# Patient Record
Sex: Male | Born: 1940 | Race: White | Hispanic: No | Marital: Single
Health system: Southern US, Community
[De-identification: ages and names within clinical notes are randomized; demographics above are authoritative.]

---

## 2006-06-19 ENCOUNTER — Ambulatory Visit: Payer: Self-pay | Admitting: Specialist

## 2007-02-13 ENCOUNTER — Ambulatory Visit: Payer: Self-pay | Admitting: General Surgery

## 2007-02-13 ENCOUNTER — Other Ambulatory Visit: Payer: Self-pay

## 2007-02-19 ENCOUNTER — Ambulatory Visit: Payer: Self-pay | Admitting: General Surgery

## 2008-10-31 ENCOUNTER — Emergency Department: Payer: Self-pay | Admitting: Emergency Medicine

## 2009-05-30 ENCOUNTER — Emergency Department: Payer: Self-pay | Admitting: Emergency Medicine

## 2010-11-17 ENCOUNTER — Ambulatory Visit: Payer: Self-pay | Admitting: Radiation Oncology

## 2010-12-13 ENCOUNTER — Ambulatory Visit: Payer: Self-pay | Admitting: Specialist

## 2010-12-16 ENCOUNTER — Ambulatory Visit: Payer: Self-pay | Admitting: Radiation Oncology

## 2010-12-18 ENCOUNTER — Ambulatory Visit: Payer: Self-pay | Admitting: Radiation Oncology

## 2010-12-22 ENCOUNTER — Ambulatory Visit: Payer: Self-pay | Admitting: Radiation Oncology

## 2011-01-17 ENCOUNTER — Ambulatory Visit: Payer: Self-pay | Admitting: Radiation Oncology

## 2011-02-17 ENCOUNTER — Ambulatory Visit: Payer: Self-pay | Admitting: Radiation Oncology

## 2011-03-19 ENCOUNTER — Ambulatory Visit: Payer: Self-pay | Admitting: Radiation Oncology

## 2011-04-21 ENCOUNTER — Ambulatory Visit: Payer: Self-pay | Admitting: Radiation Oncology

## 2011-05-20 ENCOUNTER — Ambulatory Visit: Payer: Self-pay | Admitting: Radiation Oncology

## 2011-08-18 ENCOUNTER — Ambulatory Visit: Payer: Self-pay | Admitting: Radiation Oncology

## 2011-08-20 LAB — PSA: PSA: 14.4 ng/mL — ABNORMAL HIGH (ref 0.0–4.0)

## 2011-09-17 ENCOUNTER — Ambulatory Visit: Payer: Self-pay | Admitting: Radiation Oncology

## 2011-12-21 ENCOUNTER — Ambulatory Visit: Payer: Self-pay | Admitting: Radiation Oncology

## 2011-12-23 LAB — PSA: PSA: 50.1 ng/mL — ABNORMAL HIGH (ref 0.0–4.0)

## 2012-01-17 ENCOUNTER — Ambulatory Visit: Payer: Self-pay | Admitting: Radiation Oncology

## 2012-03-06 ENCOUNTER — Ambulatory Visit: Payer: Self-pay | Admitting: Radiation Oncology

## 2012-03-14 ENCOUNTER — Ambulatory Visit: Payer: Self-pay | Admitting: Oncology

## 2012-03-21 ENCOUNTER — Ambulatory Visit: Payer: Self-pay | Admitting: Oncology

## 2012-03-21 LAB — CBC CANCER CENTER
Basophil #: 0.1 x10 3/mm (ref 0.0–0.1)
Basophil %: 1.2 %
HCT: 42.2 % (ref 40.0–52.0)
HGB: 14.4 g/dL (ref 13.0–18.0)
Lymphocyte %: 11.9 %
MCHC: 34.2 g/dL (ref 32.0–36.0)
Monocyte %: 8.1 %
Neutrophil #: 5.8 x10 3/mm (ref 1.4–6.5)
Neutrophil %: 76.5 %
RBC: 4.93 10*6/uL (ref 4.40–5.90)
RDW: 13.2 % (ref 11.5–14.5)

## 2012-03-21 LAB — COMPREHENSIVE METABOLIC PANEL
Alkaline Phosphatase: 419 U/L — ABNORMAL HIGH (ref 50–136)
BUN: 38 mg/dL — ABNORMAL HIGH (ref 7–18)
Bilirubin,Total: 0.3 mg/dL (ref 0.2–1.0)
Chloride: 100 mmol/L (ref 98–107)
Glucose: 205 mg/dL — ABNORMAL HIGH (ref 65–99)
Osmolality: 289 (ref 275–301)
SGOT(AST): 15 U/L (ref 15–37)
SGPT (ALT): 22 U/L (ref 12–78)
Total Protein: 7.3 g/dL (ref 6.4–8.2)

## 2012-03-22 LAB — PSA: PSA: 130.6 ng/mL — ABNORMAL HIGH (ref 0.0–4.0)

## 2012-04-18 ENCOUNTER — Ambulatory Visit: Payer: Self-pay | Admitting: Oncology

## 2012-05-04 ENCOUNTER — Emergency Department: Payer: Self-pay | Admitting: Emergency Medicine

## 2012-05-04 LAB — COMPREHENSIVE METABOLIC PANEL
Albumin: 3.6 g/dL (ref 3.4–5.0)
Alkaline Phosphatase: 813 U/L — ABNORMAL HIGH (ref 50–136)
Anion Gap: 9 (ref 7–16)
BUN: 36 mg/dL — ABNORMAL HIGH (ref 7–18)
Bilirubin,Total: 0.4 mg/dL (ref 0.2–1.0)
Calcium, Total: 8 mg/dL — ABNORMAL LOW (ref 8.5–10.1)
Co2: 20 mmol/L — ABNORMAL LOW (ref 21–32)
EGFR (African American): >60
EGFR (Non-African Amer.): 54 — ABNORMAL LOW
Glucose: 217 mg/dL — ABNORMAL HIGH (ref 65–99)
Osmolality: 289 (ref 275–301)
SGPT (ALT): 20 U/L (ref 12–78)
Sodium: 137 mmol/L (ref 136–145)
Total Protein: 7 g/dL (ref 6.4–8.2)

## 2012-05-04 LAB — CBC WITH DIFFERENTIAL/PLATELET
Basophil #: 0.1 10*3/uL (ref 0.0–0.1)
Basophil %: 1.4 %
Eosinophil #: 0.2 10*3/uL (ref 0.0–0.7)
Eosinophil %: 4.5 %
HCT: 37.6 % — ABNORMAL LOW (ref 40.0–52.0)
Lymphocyte %: 13 %
MCH: 27.7 pg (ref 26.0–34.0)
Neutrophil #: 3.6 10*3/uL (ref 1.4–6.5)
RBC: 4.46 10*6/uL (ref 4.40–5.90)
RDW: 13.4 % (ref 11.5–14.5)
WBC: 5.1 10*3/uL (ref 3.8–10.6)

## 2012-05-04 LAB — SEDIMENTATION RATE: Erythrocyte Sed Rate: 18 mm/hr (ref 0–20)

## 2012-05-07 LAB — COMPREHENSIVE METABOLIC PANEL
Albumin: 3.8 g/dL (ref 3.4–5.0)
BUN: 47 mg/dL — ABNORMAL HIGH (ref 7–18)
Bilirubin,Total: 0.3 mg/dL (ref 0.2–1.0)
Chloride: 103 mmol/L (ref 98–107)
EGFR (Non-African Amer.): 37 — ABNORMAL LOW
Glucose: 175 mg/dL — ABNORMAL HIGH (ref 65–99)
Potassium: 5.6 mmol/L — ABNORMAL HIGH (ref 3.5–5.1)
SGOT(AST): 11 U/L — ABNORMAL LOW (ref 15–37)
SGPT (ALT): 19 U/L (ref 12–78)
Sodium: 135 mmol/L — ABNORMAL LOW (ref 136–145)

## 2012-05-07 LAB — CBC CANCER CENTER
Eosinophil #: 0.1 x10 3/mm (ref 0.0–0.7)
Lymphocyte #: 0.5 x10 3/mm — ABNORMAL LOW (ref 1.0–3.6)
Lymphocyte %: 10.5 %
MCV: 85 fL (ref 80–100)
Neutrophil %: 77.1 %
RBC: 4.71 10*6/uL (ref 4.40–5.90)
RDW: 14.1 % (ref 11.5–14.5)
WBC: 5.1 x10 3/mm (ref 3.8–10.6)

## 2012-05-19 ENCOUNTER — Ambulatory Visit: Payer: Self-pay | Admitting: Oncology

## 2012-05-19 ENCOUNTER — Emergency Department: Payer: Self-pay | Admitting: Emergency Medicine

## 2012-06-04 ENCOUNTER — Ambulatory Visit: Payer: Self-pay | Admitting: Vascular Surgery

## 2012-06-04 LAB — BASIC METABOLIC PANEL
Creatinine: 1.15 mg/dL (ref 0.60–1.30)
Potassium: 4.4 mmol/L (ref 3.5–5.1)

## 2012-06-05 ENCOUNTER — Emergency Department: Payer: Self-pay | Admitting: Emergency Medicine

## 2012-06-16 ENCOUNTER — Ambulatory Visit: Payer: Self-pay | Admitting: Oncology

## 2012-06-27 LAB — COMPREHENSIVE METABOLIC PANEL
Alkaline Phosphatase: 287 U/L — ABNORMAL HIGH (ref 50–136)
Anion Gap: 12 (ref 7–16)
BUN: 29 mg/dL — ABNORMAL HIGH (ref 7–18)
Bilirubin,Total: 0.3 mg/dL (ref 0.2–1.0)
Calcium, Total: 9.1 mg/dL (ref 8.5–10.1)
Chloride: 101 mmol/L (ref 98–107)
Co2: 26 mmol/L (ref 21–32)
EGFR (African American): 53 — ABNORMAL LOW
EGFR (Non-African Amer.): 45 — ABNORMAL LOW
Osmolality: 294 (ref 275–301)
Potassium: 4.6 mmol/L (ref 3.5–5.1)
SGOT(AST): 13 U/L — ABNORMAL LOW (ref 15–37)

## 2012-06-27 LAB — CBC CANCER CENTER
Eosinophil %: 5.7 %
Lymphocyte #: 0.9 x10 3/mm — ABNORMAL LOW (ref 1.0–3.6)
MCH: 28.5 pg (ref 26.0–34.0)
MCHC: 33 g/dL (ref 32.0–36.0)
Monocyte #: 0.4 x10 3/mm (ref 0.2–1.0)
Neutrophil #: 3.3 x10 3/mm (ref 1.4–6.5)
Neutrophil %: 66.9 %
Platelet: 266 x10 3/mm (ref 150–440)
RBC: 4.69 10*6/uL (ref 4.40–5.90)
RDW: 14.2 % (ref 11.5–14.5)
WBC: 5 x10 3/mm (ref 3.8–10.6)

## 2012-06-28 LAB — PSA: PSA: 27.1 ng/mL — ABNORMAL HIGH (ref 0.0–4.0)

## 2012-07-17 ENCOUNTER — Ambulatory Visit: Payer: Self-pay | Admitting: Oncology

## 2012-07-25 LAB — CBC CANCER CENTER
Basophil #: 0.1 x10 3/mm (ref 0.0–0.1)
Basophil %: 1.4 %
Eosinophil #: 0.2 x10 3/mm (ref 0.0–0.7)
Eosinophil %: 3.5 %
HCT: 37.1 % — ABNORMAL LOW (ref 40.0–52.0)
HGB: 12.4 g/dL — ABNORMAL LOW (ref 13.0–18.0)
Lymphocyte #: 0.8 x10 3/mm — ABNORMAL LOW (ref 1.0–3.6)
MCH: 28.5 pg (ref 26.0–34.0)
MCHC: 33.4 g/dL (ref 32.0–36.0)
Monocyte #: 0.5 x10 3/mm (ref 0.2–1.0)
Monocyte %: 8.4 %
Neutrophil #: 3.9 x10 3/mm (ref 1.4–6.5)
Neutrophil %: 72.2 %
Platelet: 272 x10 3/mm (ref 150–440)
RBC: 4.33 10*6/uL — ABNORMAL LOW (ref 4.40–5.90)
WBC: 5.4 x10 3/mm (ref 3.8–10.6)

## 2012-07-25 LAB — BASIC METABOLIC PANEL
Calcium, Total: 8.2 mg/dL — ABNORMAL LOW (ref 8.5–10.1)
Chloride: 101 mmol/L (ref 98–107)
Co2: 22 mmol/L (ref 21–32)
Creatinine: 1.58 mg/dL — ABNORMAL HIGH (ref 0.60–1.30)
EGFR (African American): 50 — ABNORMAL LOW
EGFR (Non-African Amer.): 43 — ABNORMAL LOW
Sodium: 135 mmol/L — ABNORMAL LOW (ref 136–145)

## 2012-07-26 LAB — PSA: PSA: 20.4 ng/mL — ABNORMAL HIGH (ref 0.0–4.0)

## 2012-08-16 ENCOUNTER — Ambulatory Visit: Payer: Self-pay | Admitting: Oncology

## 2012-09-05 LAB — COMPREHENSIVE METABOLIC PANEL
Alkaline Phosphatase: 162 U/L — ABNORMAL HIGH (ref 50–136)
BUN: 28 mg/dL — ABNORMAL HIGH (ref 7–18)
Chloride: 103 mmol/L (ref 98–107)
Co2: 23 mmol/L (ref 21–32)
Creatinine: 1.37 mg/dL — ABNORMAL HIGH (ref 0.60–1.30)
EGFR (African American): 60 — ABNORMAL LOW
Glucose: 299 mg/dL — ABNORMAL HIGH (ref 65–99)
Osmolality: 290 (ref 275–301)
SGPT (ALT): 28 U/L (ref 12–78)
Sodium: 137 mmol/L (ref 136–145)
Total Protein: 6.7 g/dL (ref 6.4–8.2)

## 2012-09-05 LAB — CBC CANCER CENTER
Basophil #: 0.1 x10 3/mm (ref 0.0–0.1)
Basophil %: 1.4 %
Eosinophil #: 0.3 x10 3/mm (ref 0.0–0.7)
HCT: 38.3 % — ABNORMAL LOW (ref 40.0–52.0)
HGB: 13 g/dL (ref 13.0–18.0)
Lymphocyte %: 11.8 %
MCH: 29.4 pg (ref 26.0–34.0)
Monocyte #: 0.3 x10 3/mm (ref 0.2–1.0)
Monocyte %: 6 %
Neutrophil #: 4.3 x10 3/mm (ref 1.4–6.5)

## 2012-09-06 LAB — PSA: PSA: 16.2 ng/mL — ABNORMAL HIGH (ref 0.0–4.0)

## 2012-09-16 ENCOUNTER — Ambulatory Visit: Payer: Self-pay | Admitting: Oncology

## 2012-10-23 ENCOUNTER — Ambulatory Visit: Payer: Self-pay | Admitting: Oncology

## 2012-10-24 LAB — COMPREHENSIVE METABOLIC PANEL
Albumin: 3.5 g/dL (ref 3.4–5.0)
Anion Gap: 8 (ref 7–16)
Bilirubin,Total: 0.3 mg/dL (ref 0.2–1.0)
Co2: 20 mmol/L — ABNORMAL LOW (ref 21–32)
Creatinine: 1.79 mg/dL — ABNORMAL HIGH (ref 0.60–1.30)
EGFR (African American): 43 — ABNORMAL LOW
EGFR (Non-African Amer.): 37 — ABNORMAL LOW
Osmolality: 300 (ref 275–301)
Potassium: 4.9 mmol/L (ref 3.5–5.1)
SGOT(AST): 29 U/L (ref 15–37)
SGPT (ALT): 60 U/L (ref 12–78)

## 2012-10-24 LAB — CBC CANCER CENTER
Basophil #: 0.1 x10 3/mm (ref 0.0–0.1)
Basophil %: 1 %
Eosinophil #: 0.2 x10 3/mm (ref 0.0–0.7)
Eosinophil %: 4.4 %
HCT: 36.9 % — ABNORMAL LOW (ref 40.0–52.0)
Lymphocyte #: 0.7 x10 3/mm — ABNORMAL LOW (ref 1.0–3.6)
Lymphocyte %: 13.9 %
MCHC: 34.1 g/dL (ref 32.0–36.0)
Monocyte #: 0.4 x10 3/mm (ref 0.2–1.0)
Monocyte %: 7.7 %
Neutrophil %: 73 %
Platelet: 295 x10 3/mm (ref 150–440)
RBC: 4.3 10*6/uL — ABNORMAL LOW (ref 4.40–5.90)
WBC: 5.4 x10 3/mm (ref 3.8–10.6)

## 2012-10-25 LAB — PSA: PSA: 21.6 ng/mL — ABNORMAL HIGH (ref 0.0–4.0)

## 2012-11-16 ENCOUNTER — Ambulatory Visit: Payer: Self-pay | Admitting: Oncology

## 2012-11-29 LAB — CBC CANCER CENTER
Basophil #: 0.1 x10 3/mm (ref 0.0–0.1)
Eosinophil #: 0.2 x10 3/mm (ref 0.0–0.7)
Eosinophil %: 4.8 %
HGB: 13.3 g/dL (ref 13.0–18.0)
Lymphocyte #: 0.9 x10 3/mm — ABNORMAL LOW (ref 1.0–3.6)
MCH: 30 pg (ref 26.0–34.0)
MCHC: 34.9 g/dL (ref 32.0–36.0)
Platelet: 308 x10 3/mm (ref 150–440)
RBC: 4.42 10*6/uL (ref 4.40–5.90)
WBC: 4.4 x10 3/mm (ref 3.8–10.6)

## 2012-11-29 LAB — COMPREHENSIVE METABOLIC PANEL
Alkaline Phosphatase: 133 U/L (ref 50–136)
BUN: 29 mg/dL — ABNORMAL HIGH (ref 7–18)
Bilirubin,Total: 0.3 mg/dL (ref 0.2–1.0)
Calcium, Total: 9.3 mg/dL (ref 8.5–10.1)
Chloride: 103 mmol/L (ref 98–107)
Co2: 25 mmol/L (ref 21–32)
Creatinine: 1.52 mg/dL — ABNORMAL HIGH (ref 0.60–1.30)
EGFR (African American): 53 — ABNORMAL LOW
EGFR (Non-African Amer.): 45 — ABNORMAL LOW
Glucose: 198 mg/dL — ABNORMAL HIGH (ref 65–99)
Osmolality: 285 (ref 275–301)
Potassium: 4.7 mmol/L (ref 3.5–5.1)
SGPT (ALT): 32 U/L (ref 12–78)
Total Protein: 7.1 g/dL (ref 6.4–8.2)

## 2012-11-30 LAB — PSA: PSA: 24.3 ng/mL — ABNORMAL HIGH (ref 0.0–4.0)

## 2012-12-17 ENCOUNTER — Ambulatory Visit: Payer: Self-pay | Admitting: Oncology

## 2013-01-01 ENCOUNTER — Emergency Department: Payer: Self-pay | Admitting: Emergency Medicine

## 2013-01-01 LAB — URINALYSIS, COMPLETE
Bilirubin,UR: NEGATIVE
Blood: NEGATIVE
Glucose,UR: 300 mg/dL (ref 0–75)
Hyaline Cast: 5
Ketone: NEGATIVE
Leukocyte Esterase: NEGATIVE
Nitrite: NEGATIVE
Protein: NEGATIVE
Specific Gravity: 1.015 (ref 1.003–1.030)
WBC UR: 1 /HPF (ref 0–5)

## 2013-01-01 LAB — CBC
HGB: 14.5 g/dL (ref 13.0–18.0)
MCHC: 36 g/dL (ref 32.0–36.0)
Platelet: 283 10*3/uL (ref 150–440)

## 2013-01-01 LAB — COMPREHENSIVE METABOLIC PANEL
Alkaline Phosphatase: 251 U/L — ABNORMAL HIGH (ref 50–136)
Chloride: 102 mmol/L (ref 98–107)
Co2: 19 mmol/L — ABNORMAL LOW (ref 21–32)
EGFR (African American): 51 — ABNORMAL LOW
EGFR (Non-African Amer.): 44 — ABNORMAL LOW
Glucose: 440 mg/dL — ABNORMAL HIGH (ref 65–99)
SGOT(AST): 15 U/L (ref 15–37)
SGPT (ALT): 25 U/L (ref 12–78)
Total Protein: 7.1 g/dL (ref 6.4–8.2)

## 2013-02-12 ENCOUNTER — Ambulatory Visit: Payer: Self-pay | Admitting: Oncology

## 2013-02-19 ENCOUNTER — Ambulatory Visit: Payer: Self-pay | Admitting: Oncology

## 2013-02-19 LAB — CBC CANCER CENTER
Basophil #: 0 x10 3/mm (ref 0.0–0.1)
Eosinophil #: 0.3 x10 3/mm (ref 0.0–0.7)
Eosinophil %: 4.1 %
Lymphocyte #: 1 x10 3/mm (ref 1.0–3.6)
MCH: 28.1 pg (ref 26.0–34.0)
MCHC: 32.9 g/dL (ref 32.0–36.0)
Neutrophil #: 4.8 x10 3/mm (ref 1.4–6.5)
WBC: 6.6 x10 3/mm (ref 3.8–10.6)

## 2013-02-19 LAB — COMPREHENSIVE METABOLIC PANEL
Anion Gap: 11 (ref 7–16)
Bilirubin,Total: 0.3 mg/dL (ref 0.2–1.0)
Co2: 25 mmol/L (ref 21–32)
Creatinine: 1.62 mg/dL — ABNORMAL HIGH (ref 0.60–1.30)
EGFR (Non-African Amer.): 42 — ABNORMAL LOW
Osmolality: 288 (ref 275–301)
SGPT (ALT): 42 U/L (ref 12–78)
Sodium: 131 mmol/L — ABNORMAL LOW (ref 136–145)

## 2013-03-18 ENCOUNTER — Ambulatory Visit: Payer: Self-pay | Admitting: Oncology

## 2013-04-18 ENCOUNTER — Ambulatory Visit: Payer: Self-pay | Admitting: Oncology

## 2013-06-07 ENCOUNTER — Ambulatory Visit: Payer: Self-pay | Admitting: Oncology

## 2013-07-02 ENCOUNTER — Ambulatory Visit: Payer: Self-pay | Admitting: Vascular Surgery

## 2013-07-08 LAB — COMPREHENSIVE METABOLIC PANEL
ALK PHOS: 831 U/L — AB
Albumin: 2.5 g/dL — ABNORMAL LOW (ref 3.4–5.0)
Anion Gap: 7 (ref 7–16)
BILIRUBIN TOTAL: 0.4 mg/dL (ref 0.2–1.0)
BUN: 20 mg/dL — ABNORMAL HIGH (ref 7–18)
Calcium, Total: 8.6 mg/dL (ref 8.5–10.1)
Chloride: 100 mmol/L (ref 98–107)
Co2: 23 mmol/L (ref 21–32)
Creatinine: 1.68 mg/dL — ABNORMAL HIGH (ref 0.60–1.30)
EGFR (African American): 46 — ABNORMAL LOW
EGFR (Non-African Amer.): 40 — ABNORMAL LOW
Glucose: 442 mg/dL — ABNORMAL HIGH (ref 65–99)
Osmolality: 282 (ref 275–301)
Potassium: 4.3 mmol/L (ref 3.5–5.1)
SGOT(AST): 16 U/L (ref 15–37)
SGPT (ALT): 11 U/L — ABNORMAL LOW (ref 12–78)
SODIUM: 130 mmol/L — AB (ref 136–145)
Total Protein: 6.4 g/dL (ref 6.4–8.2)

## 2013-07-08 LAB — CBC
HCT: 28.2 % — AB (ref 40.0–52.0)
HGB: 9.2 g/dL — ABNORMAL LOW (ref 13.0–18.0)
MCH: 27 pg (ref 26.0–34.0)
MCHC: 32.8 g/dL (ref 32.0–36.0)
MCV: 83 fL (ref 80–100)
PLATELETS: 369 10*3/uL (ref 150–440)
RBC: 3.42 10*6/uL — AB (ref 4.40–5.90)
RDW: 13.9 % (ref 11.5–14.5)
WBC: 12.3 10*3/uL — AB (ref 3.8–10.6)

## 2013-07-08 LAB — PROTIME-INR
INR: 1.2
Prothrombin Time: 15.3 secs — ABNORMAL HIGH (ref 11.5–14.7)

## 2013-07-08 LAB — TROPONIN I: TROPONIN-I: 0.04 ng/mL

## 2013-07-08 LAB — CK: CK, Total: 128 U/L

## 2013-07-08 LAB — LIPASE, BLOOD: LIPASE: 112 U/L (ref 73–393)

## 2013-07-09 ENCOUNTER — Ambulatory Visit: Payer: Self-pay | Admitting: Oncology

## 2013-07-09 ENCOUNTER — Inpatient Hospital Stay: Payer: Self-pay | Admitting: Internal Medicine

## 2013-07-09 LAB — URINALYSIS, COMPLETE
BILIRUBIN, UR: NEGATIVE
Bacteria: NONE SEEN
Blood: NEGATIVE
Glucose,UR: >=500 mg/dL (ref 0–75)
Ketone: NEGATIVE
Leukocyte Esterase: NEGATIVE
Nitrite: NEGATIVE
Ph: 5 (ref 4.5–8.0)
Protein: NEGATIVE
RBC,UR: NONE SEEN /HPF (ref 0–5)
SPECIFIC GRAVITY: 1.026 (ref 1.003–1.030)
Squamous Epithelial: NONE SEEN

## 2013-07-09 LAB — COMPREHENSIVE METABOLIC PANEL
Albumin: 2.3 g/dL — ABNORMAL LOW (ref 3.4–5.0)
Alkaline Phosphatase: 681 U/L — ABNORMAL HIGH
Anion Gap: 7 (ref 7–16)
BUN: 19 mg/dL — ABNORMAL HIGH (ref 7–18)
Bilirubin,Total: 1.2 mg/dL — ABNORMAL HIGH (ref 0.2–1.0)
CO2: 24 mmol/L (ref 21–32)
Calcium, Total: 8.6 mg/dL (ref 8.5–10.1)
Chloride: 104 mmol/L (ref 98–107)
Creatinine: 1.63 mg/dL — ABNORMAL HIGH (ref 0.60–1.30)
EGFR (African American): 48 — ABNORMAL LOW
EGFR (Non-African Amer.): 41 — ABNORMAL LOW
GLUCOSE: 228 mg/dL — AB (ref 65–99)
Osmolality: 280 (ref 275–301)
POTASSIUM: 4.2 mmol/L (ref 3.5–5.1)
SGOT(AST): 32 U/L (ref 15–37)
SGPT (ALT): 14 U/L (ref 12–78)
Sodium: 135 mmol/L — ABNORMAL LOW (ref 136–145)
Total Protein: 6 g/dL — ABNORMAL LOW (ref 6.4–8.2)

## 2013-07-09 LAB — HEMOGLOBIN
HGB: 10.7 g/dL — ABNORMAL LOW (ref 13.0–18.0)
HGB: 10.4 g/dL — AB (ref 13.0–18.0)

## 2013-07-09 LAB — HEMOGLOBIN A1C: Hemoglobin A1C: 10.3 % — ABNORMAL HIGH (ref 4.2–6.3)

## 2013-07-09 LAB — BILIRUBIN, DIRECT: BILIRUBIN DIRECT: 0.2 mg/dL (ref 0.00–0.20)

## 2013-07-10 LAB — CBC WITH DIFFERENTIAL/PLATELET
BASOS ABS: 0.1 10*3/uL (ref 0.0–0.1)
Basophil %: 0.8 %
EOS PCT: 2.7 %
Eosinophil #: 0.2 10*3/uL (ref 0.0–0.7)
HCT: 33.5 % — ABNORMAL LOW (ref 40.0–52.0)
HGB: 11.4 g/dL — AB (ref 13.0–18.0)
LYMPHS ABS: 0.8 10*3/uL — AB (ref 1.0–3.6)
Lymphocyte %: 10.5 %
MCH: 28.6 pg (ref 26.0–34.0)
MCHC: 34.2 g/dL (ref 32.0–36.0)
MCV: 84 fL (ref 80–100)
Monocyte #: 0.6 x10 3/mm (ref 0.2–1.0)
Monocyte %: 8.6 %
NEUTROS PCT: 77.4 %
Neutrophil #: 5.8 10*3/uL (ref 1.4–6.5)
Platelet: 322 10*3/uL (ref 150–440)
RBC: 4 10*6/uL — ABNORMAL LOW (ref 4.40–5.90)
RDW: 14.8 % — ABNORMAL HIGH (ref 11.5–14.5)
WBC: 7.5 10*3/uL (ref 3.8–10.6)

## 2013-07-10 LAB — BASIC METABOLIC PANEL
Anion Gap: 7 (ref 7–16)
BUN: 18 mg/dL (ref 7–18)
CO2: 21 mmol/L (ref 21–32)
CREATININE: 1.53 mg/dL — AB (ref 0.60–1.30)
Calcium, Total: 8.6 mg/dL (ref 8.5–10.1)
Chloride: 103 mmol/L (ref 98–107)
EGFR (African American): 52 — ABNORMAL LOW
EGFR (Non-African Amer.): 45 — ABNORMAL LOW
GLUCOSE: 227 mg/dL — AB (ref 65–99)
Osmolality: 272 (ref 275–301)
POTASSIUM: 4.2 mmol/L (ref 3.5–5.1)
SODIUM: 131 mmol/L — AB (ref 136–145)

## 2013-07-11 LAB — HEMOGLOBIN: HGB: 11.3 g/dL — ABNORMAL LOW (ref 13.0–18.0)

## 2013-07-12 LAB — BASIC METABOLIC PANEL
Anion Gap: 7 (ref 7–16)
BUN: 22 mg/dL — ABNORMAL HIGH (ref 7–18)
CHLORIDE: 104 mmol/L (ref 98–107)
CO2: 25 mmol/L (ref 21–32)
CREATININE: 1.71 mg/dL — AB (ref 0.60–1.30)
Calcium, Total: 8.7 mg/dL (ref 8.5–10.1)
EGFR (African American): 45 — ABNORMAL LOW
GFR CALC NON AF AMER: 39 — AB
Glucose: 99 mg/dL (ref 65–99)
Osmolality: 275 (ref 275–301)
POTASSIUM: 4 mmol/L (ref 3.5–5.1)
Sodium: 136 mmol/L (ref 136–145)

## 2013-07-13 LAB — BASIC METABOLIC PANEL
ANION GAP: 5 — AB (ref 7–16)
BUN: 22 mg/dL — AB (ref 7–18)
CALCIUM: 8.1 mg/dL — AB (ref 8.5–10.1)
CO2: 26 mmol/L (ref 21–32)
CREATININE: 1.77 mg/dL — AB (ref 0.60–1.30)
Chloride: 100 mmol/L (ref 98–107)
EGFR (Non-African Amer.): 38 — ABNORMAL LOW
GFR CALC AF AMER: 44 — AB
Glucose: 350 mg/dL — ABNORMAL HIGH (ref 65–99)
OSMOLALITY: 280 (ref 275–301)
POTASSIUM: 4.6 mmol/L (ref 3.5–5.1)
SODIUM: 131 mmol/L — AB (ref 136–145)

## 2013-07-13 LAB — CBC WITH DIFFERENTIAL/PLATELET
Basophil #: 0.1 10*3/uL (ref 0.0–0.1)
Basophil %: 0.8 %
EOS PCT: 0.7 %
Eosinophil #: 0.1 10*3/uL (ref 0.0–0.7)
HCT: 33.2 % — ABNORMAL LOW (ref 40.0–52.0)
HGB: 11.3 g/dL — AB (ref 13.0–18.0)
Lymphocyte #: 0.9 10*3/uL — ABNORMAL LOW (ref 1.0–3.6)
Lymphocyte %: 9.1 %
MCH: 28.5 pg (ref 26.0–34.0)
MCHC: 34 g/dL (ref 32.0–36.0)
MCV: 84 fL (ref 80–100)
MONOS PCT: 10.9 %
Monocyte #: 1 x10 3/mm (ref 0.2–1.0)
NEUTROS ABS: 7.3 10*3/uL — AB (ref 1.4–6.5)
NEUTROS PCT: 78.5 %
Platelet: 273 10*3/uL (ref 150–440)
RBC: 3.97 10*6/uL — AB (ref 4.40–5.90)
RDW: 14.9 % — ABNORMAL HIGH (ref 11.5–14.5)
WBC: 9.4 10*3/uL (ref 3.8–10.6)

## 2013-07-14 LAB — BASIC METABOLIC PANEL
Anion Gap: 6 — ABNORMAL LOW (ref 7–16)
BUN: 21 mg/dL — ABNORMAL HIGH (ref 7–18)
CHLORIDE: 101 mmol/L (ref 98–107)
CO2: 26 mmol/L (ref 21–32)
CREATININE: 1.62 mg/dL — AB (ref 0.60–1.30)
Calcium, Total: 8.7 mg/dL (ref 8.5–10.1)
EGFR (African American): 48 — ABNORMAL LOW
GFR CALC NON AF AMER: 42 — AB
Glucose: 192 mg/dL — ABNORMAL HIGH (ref 65–99)
OSMOLALITY: 275 (ref 275–301)
Potassium: 4.1 mmol/L (ref 3.5–5.1)
Sodium: 133 mmol/L — ABNORMAL LOW (ref 136–145)

## 2013-07-14 LAB — CBC WITH DIFFERENTIAL/PLATELET
BASOS ABS: 0.1 10*3/uL (ref 0.0–0.1)
Basophil %: 0.7 %
EOS ABS: 0.3 10*3/uL (ref 0.0–0.7)
Eosinophil %: 2.9 %
HCT: 34.5 % — ABNORMAL LOW (ref 40.0–52.0)
HGB: 11.6 g/dL — AB (ref 13.0–18.0)
Lymphocyte #: 1 10*3/uL (ref 1.0–3.6)
Lymphocyte %: 11 %
MCH: 28 pg (ref 26.0–34.0)
MCHC: 33.5 g/dL (ref 32.0–36.0)
MCV: 84 fL (ref 80–100)
MONO ABS: 0.9 x10 3/mm (ref 0.2–1.0)
Monocyte %: 9.9 %
Neutrophil #: 7 10*3/uL — ABNORMAL HIGH (ref 1.4–6.5)
Neutrophil %: 75.5 %
Platelet: 310 10*3/uL (ref 150–440)
RBC: 4.13 10*6/uL — AB (ref 4.40–5.90)
RDW: 15 % — AB (ref 11.5–14.5)
WBC: 9.3 10*3/uL (ref 3.8–10.6)

## 2013-07-17 ENCOUNTER — Ambulatory Visit: Payer: Self-pay | Admitting: Oncology

## 2013-07-17 LAB — CULTURE, BLOOD (SINGLE)

## 2013-07-18 LAB — PATHOLOGY REPORT

## 2013-08-29 ENCOUNTER — Ambulatory Visit: Payer: Self-pay | Admitting: Oncology

## 2013-09-04 LAB — COMPREHENSIVE METABOLIC PANEL
AST: 11 U/L — AB (ref 15–37)
Albumin: 3.4 g/dL (ref 3.4–5.0)
Alkaline Phosphatase: 1376 U/L — ABNORMAL HIGH
Anion Gap: 10 (ref 7–16)
BUN: 25 mg/dL — ABNORMAL HIGH (ref 7–18)
Bilirubin,Total: 0.2 mg/dL (ref 0.2–1.0)
CREATININE: 1.48 mg/dL — AB (ref 0.60–1.30)
Calcium, Total: 9.1 mg/dL (ref 8.5–10.1)
Chloride: 106 mmol/L (ref 98–107)
Co2: 26 mmol/L (ref 21–32)
EGFR (Non-African Amer.): 47 — ABNORMAL LOW
GFR CALC AF AMER: 54 — AB
Glucose: 170 mg/dL — ABNORMAL HIGH (ref 65–99)
Osmolality: 291 (ref 275–301)
POTASSIUM: 4.5 mmol/L (ref 3.5–5.1)
SGPT (ALT): 17 U/L (ref 12–78)
Sodium: 142 mmol/L (ref 136–145)
TOTAL PROTEIN: 7 g/dL (ref 6.4–8.2)

## 2013-09-04 LAB — CBC CANCER CENTER
BASOS PCT: 1.5 %
Basophil #: 0.1 x10 3/mm (ref 0.0–0.1)
Eosinophil #: 0.3 x10 3/mm (ref 0.0–0.7)
Eosinophil %: 6.1 %
HCT: 34.6 % — AB (ref 40.0–52.0)
HGB: 11.4 g/dL — ABNORMAL LOW (ref 13.0–18.0)
LYMPHS PCT: 16.1 %
Lymphocyte #: 0.8 x10 3/mm — ABNORMAL LOW (ref 1.0–3.6)
MCH: 27.5 pg (ref 26.0–34.0)
MCHC: 33.1 g/dL (ref 32.0–36.0)
MCV: 83 fL (ref 80–100)
Monocyte #: 0.5 x10 3/mm (ref 0.2–1.0)
Monocyte %: 9.4 %
NEUTROS ABS: 3.5 x10 3/mm (ref 1.4–6.5)
Neutrophil %: 66.9 %
Platelet: 213 x10 3/mm (ref 150–440)
RBC: 4.16 10*6/uL — ABNORMAL LOW (ref 4.40–5.90)
RDW: 16.3 % — ABNORMAL HIGH (ref 11.5–14.5)
WBC: 5.2 x10 3/mm (ref 3.8–10.6)

## 2013-09-05 LAB — PSA: PSA: 98.3 ng/mL — ABNORMAL HIGH (ref 0.0–4.0)

## 2013-09-16 ENCOUNTER — Ambulatory Visit: Payer: Self-pay | Admitting: Oncology

## 2013-10-02 ENCOUNTER — Ambulatory Visit: Payer: Self-pay | Admitting: Urology

## 2013-10-16 ENCOUNTER — Emergency Department: Payer: Self-pay | Admitting: Emergency Medicine

## 2013-10-16 ENCOUNTER — Ambulatory Visit: Payer: Self-pay | Admitting: Oncology

## 2013-10-16 LAB — CBC CANCER CENTER
Basophil #: 0.1 "x10 3/mm "
Basophil %: 1 %
Eosinophil #: 0.3 "x10 3/mm "
Eosinophil %: 6.6 %
HCT: 36.4 % — ABNORMAL LOW
HGB: 12 g/dL — ABNORMAL LOW
Lymphocyte %: 17.7 %
Lymphs Abs: 0.9 "x10 3/mm " — ABNORMAL LOW
MCH: 27.9 pg
MCHC: 33 g/dL
MCV: 85 fL
Monocyte #: 0.5 "x10 3/mm "
Monocyte %: 8.8 %
Neutrophil #: 3.4 "x10 3/mm "
Neutrophil %: 65.9 %
Platelet: 208 "x10 3/mm "
RBC: 4.3 "x10 6/mm " — ABNORMAL LOW
RDW: 15.2 % — ABNORMAL HIGH
WBC: 5.2 "x10 3/mm "

## 2013-10-16 LAB — COMPREHENSIVE METABOLIC PANEL WITH GFR
Albumin: 3.4 g/dL
Alkaline Phosphatase: 909 U/L — ABNORMAL HIGH
Anion Gap: 7
BUN: 29 mg/dL — ABNORMAL HIGH
Bilirubin,Total: 0.3 mg/dL
Calcium, Total: 9.2 mg/dL
Chloride: 104 mmol/L
Co2: 26 mmol/L
Creatinine: 1.57 mg/dL — ABNORMAL HIGH
EGFR (African American): 50 — ABNORMAL LOW
EGFR (Non-African Amer.): 43 — ABNORMAL LOW
Glucose: 118 mg/dL — ABNORMAL HIGH
Osmolality: 281
Potassium: 4.6 mmol/L
SGOT(AST): 17 U/L
SGPT (ALT): 18 U/L
Sodium: 137 mmol/L
Total Protein: 6.9 g/dL

## 2013-10-17 LAB — PSA: PSA: 127.4 ng/mL — AB (ref 0.0–4.0)

## 2013-10-22 ENCOUNTER — Ambulatory Visit: Payer: Self-pay | Admitting: Urology

## 2013-11-03 ENCOUNTER — Emergency Department: Payer: Self-pay | Admitting: Emergency Medicine

## 2013-11-16 ENCOUNTER — Ambulatory Visit: Payer: Self-pay | Admitting: Oncology

## 2013-12-16 LAB — CBC CANCER CENTER
Basophil #: 0.1 x10 3/mm (ref 0.0–0.1)
Basophil %: 1.1 %
EOS ABS: 0.3 x10 3/mm (ref 0.0–0.7)
Eosinophil %: 5.1 %
HCT: 35.4 % — ABNORMAL LOW (ref 40.0–52.0)
HGB: 11.8 g/dL — ABNORMAL LOW (ref 13.0–18.0)
Lymphocyte #: 1 x10 3/mm (ref 1.0–3.6)
Lymphocyte %: 14.8 %
MCH: 27.7 pg (ref 26.0–34.0)
MCHC: 33.2 g/dL (ref 32.0–36.0)
MCV: 84 fL (ref 80–100)
Monocyte #: 0.6 x10 3/mm (ref 0.2–1.0)
Monocyte %: 9.3 %
NEUTROS PCT: 69.7 %
Neutrophil #: 4.5 x10 3/mm (ref 1.4–6.5)
PLATELETS: 353 x10 3/mm (ref 150–440)
RBC: 4.24 10*6/uL — ABNORMAL LOW (ref 4.40–5.90)
RDW: 13.2 % (ref 11.5–14.5)
WBC: 6.4 x10 3/mm (ref 3.8–10.6)

## 2013-12-16 LAB — COMPREHENSIVE METABOLIC PANEL
ALBUMIN: 3.3 g/dL — AB (ref 3.4–5.0)
ANION GAP: 11 (ref 7–16)
Alkaline Phosphatase: 959 U/L — ABNORMAL HIGH
BUN: 28 mg/dL — ABNORMAL HIGH (ref 7–18)
Bilirubin,Total: 0.3 mg/dL (ref 0.2–1.0)
CALCIUM: 9.5 mg/dL (ref 8.5–10.1)
CO2: 25 mmol/L (ref 21–32)
Chloride: 99 mmol/L (ref 98–107)
Creatinine: 1.6 mg/dL — ABNORMAL HIGH (ref 0.60–1.30)
EGFR (Non-African Amer.): 42 — ABNORMAL LOW
GFR CALC AF AMER: 49 — AB
Glucose: 172 mg/dL — ABNORMAL HIGH (ref 65–99)
Osmolality: 280 (ref 275–301)
Potassium: 4.4 mmol/L (ref 3.5–5.1)
SGOT(AST): 25 U/L (ref 15–37)
SGPT (ALT): 14 U/L
Sodium: 135 mmol/L — ABNORMAL LOW (ref 136–145)
Total Protein: 7.4 g/dL (ref 6.4–8.2)

## 2013-12-17 ENCOUNTER — Ambulatory Visit: Payer: Self-pay | Admitting: Internal Medicine

## 2013-12-17 ENCOUNTER — Ambulatory Visit: Payer: Self-pay | Admitting: Oncology

## 2013-12-17 LAB — PSA: PSA: 256.4 ng/mL — ABNORMAL HIGH (ref 0.0–4.0)

## 2013-12-24 ENCOUNTER — Ambulatory Visit: Payer: Self-pay | Admitting: Urology

## 2013-12-24 LAB — CBC
HCT: 34.1 % — ABNORMAL LOW (ref 40.0–52.0)
HGB: 11 g/dL — AB (ref 13.0–18.0)
MCH: 26.6 pg (ref 26.0–34.0)
MCHC: 32.2 g/dL (ref 32.0–36.0)
MCV: 83 fL (ref 80–100)
PLATELETS: 268 10*3/uL (ref 150–440)
RBC: 4.13 10*6/uL — ABNORMAL LOW (ref 4.40–5.90)
RDW: 13.5 % (ref 11.5–14.5)
WBC: 5.9 10*3/uL (ref 3.8–10.6)

## 2013-12-24 LAB — BASIC METABOLIC PANEL
ANION GAP: 7 (ref 7–16)
BUN: 30 mg/dL — ABNORMAL HIGH (ref 7–18)
CALCIUM: 8.9 mg/dL (ref 8.5–10.1)
CHLORIDE: 99 mmol/L (ref 98–107)
CO2: 24 mmol/L (ref 21–32)
Creatinine: 1.62 mg/dL — ABNORMAL HIGH (ref 0.60–1.30)
EGFR (African American): 48 — ABNORMAL LOW
EGFR (Non-African Amer.): 42 — ABNORMAL LOW
GLUCOSE: 230 mg/dL — AB (ref 65–99)
Osmolality: 274 (ref 275–301)
POTASSIUM: 4.3 mmol/L (ref 3.5–5.1)
SODIUM: 130 mmol/L — AB (ref 136–145)

## 2014-01-06 ENCOUNTER — Ambulatory Visit: Payer: Self-pay | Admitting: Urology

## 2014-01-07 ENCOUNTER — Observation Stay: Payer: Self-pay | Admitting: Internal Medicine

## 2014-01-07 LAB — CBC
HCT: 28.8 % — ABNORMAL LOW (ref 40.0–52.0)
HGB: 9.8 g/dL — AB (ref 13.0–18.0)
MCH: 27.4 pg (ref 26.0–34.0)
MCHC: 34 g/dL (ref 32.0–36.0)
MCV: 81 fL (ref 80–100)
Platelet: 225 10*3/uL (ref 150–440)
RBC: 3.58 10*6/uL — AB (ref 4.40–5.90)
RDW: 13.7 % (ref 11.5–14.5)
WBC: 9.3 10*3/uL (ref 3.8–10.6)

## 2014-01-07 LAB — URINALYSIS, COMPLETE
Bacteria: NONE SEEN
Bilirubin,UR: NEGATIVE
Glucose,UR: 50 mg/dL
Ketone: NEGATIVE
Leukocyte Esterase: NEGATIVE
Nitrite: NEGATIVE
Ph: 6
Protein: 150
RBC,UR: 19450 /HPF
Specific Gravity: 1.015
Squamous Epithelial: NONE SEEN
WBC UR: 144 /HPF

## 2014-01-07 LAB — COMPREHENSIVE METABOLIC PANEL
ANION GAP: 8 (ref 7–16)
AST: 623 U/L — AB (ref 15–37)
Albumin: 2.8 g/dL — ABNORMAL LOW (ref 3.4–5.0)
Alkaline Phosphatase: 1352 U/L — ABNORMAL HIGH
BUN: 29 mg/dL — AB (ref 7–18)
Bilirubin,Total: 0.4 mg/dL (ref 0.2–1.0)
CO2: 24 mmol/L (ref 21–32)
CREATININE: 1.68 mg/dL — AB (ref 0.60–1.30)
Calcium, Total: 8.7 mg/dL (ref 8.5–10.1)
Chloride: 97 mmol/L — ABNORMAL LOW (ref 98–107)
EGFR (African American): 46 — ABNORMAL LOW
GFR CALC NON AF AMER: 40 — AB
Glucose: 243 mg/dL — ABNORMAL HIGH (ref 65–99)
OSMOLALITY: 273 (ref 275–301)
Potassium: 4.7 mmol/L (ref 3.5–5.1)
SGPT (ALT): 20 U/L
SODIUM: 129 mmol/L — AB (ref 136–145)
TOTAL PROTEIN: 6.4 g/dL (ref 6.4–8.2)

## 2014-01-07 LAB — PRO B NATRIURETIC PEPTIDE: B-TYPE NATIURETIC PEPTID: 1468 pg/mL — AB (ref 0–125)

## 2014-01-07 LAB — TROPONIN I: Troponin-I: <0.02 ng/mL

## 2014-01-07 LAB — TSH: Thyroid Stimulating Horm: 1.78 u[IU]/mL

## 2014-01-09 LAB — BASIC METABOLIC PANEL
ANION GAP: 7 (ref 7–16)
BUN: 28 mg/dL — AB (ref 7–18)
CHLORIDE: 103 mmol/L (ref 98–107)
CO2: 22 mmol/L (ref 21–32)
Calcium, Total: 7.9 mg/dL — ABNORMAL LOW (ref 8.5–10.1)
Creatinine: 1.36 mg/dL — ABNORMAL HIGH (ref 0.60–1.30)
EGFR (African American): >60
EGFR (Non-African Amer.): 55 — ABNORMAL LOW
GLUCOSE: 222 mg/dL — AB (ref 65–99)
OSMOLALITY: 277 (ref 275–301)
Potassium: 4.4 mmol/L (ref 3.5–5.1)
Sodium: 132 mmol/L — ABNORMAL LOW (ref 136–145)

## 2014-01-11 LAB — URINALYSIS, COMPLETE
BILIRUBIN, UR: NEGATIVE
Glucose,UR: NEGATIVE mg/dL (ref 0–75)
Ketone: NEGATIVE
Nitrite: NEGATIVE
PH: 5 (ref 4.5–8.0)
SQUAMOUS EPITHELIAL: NONE SEEN
Specific Gravity: 1.013 (ref 1.003–1.030)

## 2014-01-12 LAB — BASIC METABOLIC PANEL
Anion Gap: 7 (ref 7–16)
BUN: 25 mg/dL — AB (ref 7–18)
CALCIUM: 8.4 mg/dL — AB (ref 8.5–10.1)
CHLORIDE: 104 mmol/L (ref 98–107)
Co2: 25 mmol/L (ref 21–32)
Creatinine: 1.34 mg/dL — ABNORMAL HIGH (ref 0.60–1.30)
EGFR (Non-African Amer.): 56 — ABNORMAL LOW
GLUCOSE: 167 mg/dL — AB (ref 65–99)
Osmolality: 280 (ref 275–301)
Potassium: 4.1 mmol/L (ref 3.5–5.1)
SODIUM: 136 mmol/L (ref 136–145)

## 2014-01-16 LAB — BASIC METABOLIC PANEL
Anion Gap: 8 (ref 7–16)
BUN: 24 mg/dL — ABNORMAL HIGH (ref 7–18)
CALCIUM: 8.1 mg/dL — AB (ref 8.5–10.1)
CHLORIDE: 102 mmol/L (ref 98–107)
Co2: 26 mmol/L (ref 21–32)
Creatinine: 1.29 mg/dL (ref 0.60–1.30)
GFR CALC NON AF AMER: 58 — AB
Glucose: 154 mg/dL — ABNORMAL HIGH (ref 65–99)
OSMOLALITY: 279 (ref 275–301)
POTASSIUM: 4.2 mmol/L (ref 3.5–5.1)
SODIUM: 136 mmol/L (ref 136–145)

## 2014-01-16 LAB — CBC WITH DIFFERENTIAL/PLATELET
BASOS PCT: 1.2 %
Basophil #: 0.1 10*3/uL (ref 0.0–0.1)
Eosinophil #: 0.3 10*3/uL (ref 0.0–0.7)
Eosinophil %: 5 %
HCT: 26.7 % — AB (ref 40.0–52.0)
HGB: 9.1 g/dL — AB (ref 13.0–18.0)
LYMPHS ABS: 0.7 10*3/uL — AB (ref 1.0–3.6)
Lymphocyte %: 12.2 %
MCH: 27.3 pg (ref 26.0–34.0)
MCHC: 33.9 g/dL (ref 32.0–36.0)
MCV: 80 fL (ref 80–100)
Monocyte #: 0.6 x10 3/mm (ref 0.2–1.0)
Monocyte %: 10.2 %
NEUTROS PCT: 71.4 %
Neutrophil #: 4.3 10*3/uL (ref 1.4–6.5)
Platelet: 465 10*3/uL — ABNORMAL HIGH (ref 150–440)
RBC: 3.32 10*6/uL — AB (ref 4.40–5.90)
RDW: 13.8 % (ref 11.5–14.5)
WBC: 6 10*3/uL (ref 3.8–10.6)

## 2014-01-18 ENCOUNTER — Ambulatory Visit: Payer: Self-pay | Admitting: Internal Medicine

## 2014-01-24 ENCOUNTER — Emergency Department: Payer: Self-pay | Admitting: Student

## 2014-01-24 LAB — CBC
HCT: 28.8 % — AB (ref 40.0–52.0)
HGB: 9.1 g/dL — ABNORMAL LOW (ref 13.0–18.0)
MCH: 26 pg (ref 26.0–34.0)
MCHC: 31.5 g/dL — AB (ref 32.0–36.0)
MCV: 83 fL (ref 80–100)
PLATELETS: 450 10*3/uL — AB (ref 150–440)
RBC: 3.49 10*6/uL — AB (ref 4.40–5.90)
RDW: 14.1 % (ref 11.5–14.5)
WBC: 6.9 10*3/uL (ref 3.8–10.6)

## 2014-01-24 LAB — URINALYSIS, COMPLETE
BILIRUBIN, UR: NEGATIVE
Glucose,UR: 50 mg/dL (ref 0–75)
Ketone: NEGATIVE
Nitrite: NEGATIVE
Ph: 6 (ref 4.5–8.0)
SPECIFIC GRAVITY: 1.018 (ref 1.003–1.030)
Squamous Epithelial: NONE SEEN

## 2014-01-24 LAB — COMPREHENSIVE METABOLIC PANEL
ALT: 13 U/L — AB
Albumin: 2.8 g/dL — ABNORMAL LOW (ref 3.4–5.0)
Alkaline Phosphatase: 1909 U/L — ABNORMAL HIGH
Anion Gap: 11 (ref 7–16)
BUN: 21 mg/dL — ABNORMAL HIGH (ref 7–18)
Bilirubin,Total: 0.4 mg/dL (ref 0.2–1.0)
Calcium, Total: 8.5 mg/dL (ref 8.5–10.1)
Chloride: 99 mmol/L (ref 98–107)
Co2: 26 mmol/L (ref 21–32)
Creatinine: 1.39 mg/dL — ABNORMAL HIGH (ref 0.60–1.30)
GFR CALC NON AF AMER: 53 — AB
GLUCOSE: 258 mg/dL — AB (ref 65–99)
Osmolality: 284 (ref 275–301)
Potassium: 4.6 mmol/L (ref 3.5–5.1)
SGOT(AST): 115 U/L — ABNORMAL HIGH (ref 15–37)
SODIUM: 136 mmol/L (ref 136–145)
Total Protein: 7 g/dL (ref 6.4–8.2)

## 2014-01-24 LAB — DRUG SCREEN, URINE

## 2014-01-24 LAB — ETHANOL: Ethanol: <3 mg/dL

## 2014-01-24 LAB — ACETAMINOPHEN LEVEL: Acetaminophen: <2 ug/mL

## 2014-01-24 LAB — SALICYLATE LEVEL: Salicylates, Serum: <1.7 mg/dL

## 2014-01-26 LAB — COMPREHENSIVE METABOLIC PANEL
Albumin: 2.4 g/dL — ABNORMAL LOW (ref 3.4–5.0)
Alkaline Phosphatase: 1560 U/L — ABNORMAL HIGH
Anion Gap: 6 — ABNORMAL LOW (ref 7–16)
BUN: 17 mg/dL (ref 7–18)
Bilirubin,Total: 0.4 mg/dL (ref 0.2–1.0)
CO2: 26 mmol/L (ref 21–32)
Calcium, Total: 8.4 mg/dL — ABNORMAL LOW (ref 8.5–10.1)
Chloride: 102 mmol/L (ref 98–107)
Creatinine: 1.32 mg/dL — ABNORMAL HIGH (ref 0.60–1.30)
EGFR (Non-African Amer.): 57 — ABNORMAL LOW
Glucose: 312 mg/dL — ABNORMAL HIGH (ref 65–99)
Osmolality: 282 (ref 275–301)
POTASSIUM: 4.4 mmol/L (ref 3.5–5.1)
SGOT(AST): 26 U/L (ref 15–37)
SGPT (ALT): 10 U/L — ABNORMAL LOW
Sodium: 134 mmol/L — ABNORMAL LOW (ref 136–145)
Total Protein: 6.7 g/dL (ref 6.4–8.2)

## 2014-01-26 LAB — CBC
HCT: 29.8 % — AB (ref 40.0–52.0)
HGB: 9.4 g/dL — ABNORMAL LOW (ref 13.0–18.0)
MCH: 26.1 pg (ref 26.0–34.0)
MCHC: 31.6 g/dL — AB (ref 32.0–36.0)
MCV: 83 fL (ref 80–100)
Platelet: 326 10*3/uL (ref 150–440)
RBC: 3.6 10*6/uL — AB (ref 4.40–5.90)
RDW: 14.5 % (ref 11.5–14.5)
WBC: 5.3 10*3/uL (ref 3.8–10.6)

## 2014-02-16 ENCOUNTER — Ambulatory Visit: Payer: Self-pay | Admitting: Internal Medicine

## 2014-02-16 LAB — COMPREHENSIVE METABOLIC PANEL
ALT: 25 U/L
ANION GAP: 12 (ref 7–16)
AST: 1679 U/L — AB (ref 15–37)
Albumin: 2.8 g/dL — ABNORMAL LOW (ref 3.4–5.0)
Alkaline Phosphatase: >4000 U/L
BILIRUBIN TOTAL: 0.6 mg/dL (ref 0.2–1.0)
BUN: 29 mg/dL — ABNORMAL HIGH (ref 7–18)
CALCIUM: 8.6 mg/dL (ref 8.5–10.1)
CHLORIDE: 95 mmol/L — AB (ref 98–107)
CO2: 22 mmol/L (ref 21–32)
Creatinine: 1.53 mg/dL — ABNORMAL HIGH (ref 0.60–1.30)
EGFR (Non-African Amer.): 48 — ABNORMAL LOW
GFR CALC AF AMER: 58 — AB
GLUCOSE: 326 mg/dL — AB (ref 65–99)
Osmolality: 277 (ref 275–301)
Potassium: 5.3 mmol/L — ABNORMAL HIGH (ref 3.5–5.1)
SODIUM: 129 mmol/L — AB (ref 136–145)
Total Protein: 7 g/dL (ref 6.4–8.2)

## 2014-02-16 LAB — CBC
HCT: 28.6 % — AB (ref 40.0–52.0)
HGB: 9.3 g/dL — AB (ref 13.0–18.0)
MCH: 26.3 pg (ref 26.0–34.0)
MCHC: 32.5 g/dL (ref 32.0–36.0)
MCV: 81 fL (ref 80–100)
PLATELETS: 253 10*3/uL (ref 150–440)
RBC: 3.53 10*6/uL — AB (ref 4.40–5.90)
RDW: 16 % — ABNORMAL HIGH (ref 11.5–14.5)
WBC: 8.2 10*3/uL (ref 3.8–10.6)

## 2014-02-16 LAB — TROPONIN I: Troponin-I: 0.03 ng/mL

## 2014-02-17 ENCOUNTER — Inpatient Hospital Stay: Payer: Self-pay | Admitting: Internal Medicine

## 2014-02-17 LAB — URINALYSIS, COMPLETE
Bilirubin,UR: NEGATIVE
Glucose,UR: >=500 mg/dL (ref 0–75)
Ketone: NEGATIVE
Leukocyte Esterase: NEGATIVE
NITRITE: NEGATIVE
PH: 6 (ref 4.5–8.0)
RBC,UR: 3908 /HPF (ref 0–5)
SQUAMOUS EPITHELIAL: NONE SEEN
Specific Gravity: 1.029 (ref 1.003–1.030)
WBC UR: 90 /HPF (ref 0–5)

## 2014-02-17 LAB — LIPASE, BLOOD: Lipase: 122 U/L (ref 73–393)

## 2014-02-17 LAB — CK: CK, Total: 1195 U/L — ABNORMAL HIGH

## 2014-02-18 LAB — URINE CULTURE

## 2014-02-21 LAB — BASIC METABOLIC PANEL
Anion Gap: 8 (ref 7–16)
BUN: 21 mg/dL — AB (ref 7–18)
CALCIUM: 7.8 mg/dL — AB (ref 8.5–10.1)
CHLORIDE: 105 mmol/L (ref 98–107)
Co2: 24 mmol/L (ref 21–32)
Creatinine: 1.23 mg/dL (ref 0.60–1.30)
EGFR (African American): >60
EGFR (Non-African Amer.): >60
GLUCOSE: 175 mg/dL — AB (ref 65–99)
OSMOLALITY: 281 (ref 275–301)
Potassium: 4.3 mmol/L (ref 3.5–5.1)
SODIUM: 137 mmol/L (ref 136–145)

## 2014-02-21 LAB — IRON AND TIBC
IRON BIND. CAP.(TOTAL): 135 ug/dL — AB (ref 250–450)
IRON SATURATION: 20 %
Iron: 27 ug/dL — ABNORMAL LOW (ref 65–175)
Unbound Iron-Bind.Cap.: 108 ug/dL

## 2014-02-21 LAB — CK: CK, Total: 103 U/L

## 2014-02-21 LAB — AMMONIA: AMMONIA, PLASMA: 32 umol/L (ref 11–32)

## 2014-02-21 LAB — HEMOGLOBIN: HGB: 6.9 g/dL — ABNORMAL LOW (ref 13.0–18.0)

## 2014-02-22 LAB — CULTURE, BLOOD (SINGLE)

## 2014-02-23 LAB — BASIC METABOLIC PANEL WITH GFR
Anion Gap: 9
BUN: 14 mg/dL
Calcium, Total: 7.7 mg/dL — ABNORMAL LOW
Chloride: 106 mmol/L
Co2: 23 mmol/L
Creatinine: 1.03 mg/dL
EGFR (African American): >60
EGFR (Non-African Amer.): >60
Glucose: 186 mg/dL — ABNORMAL HIGH
Osmolality: 281
Potassium: 4.1 mmol/L
Sodium: 138 mmol/L

## 2014-02-23 LAB — CBC WITH DIFFERENTIAL/PLATELET
BASOS PCT: 0.7 %
Basophil #: 0 10*3/uL (ref 0.0–0.1)
Eosinophil #: 0.2 10*3/uL (ref 0.0–0.7)
Eosinophil %: 2.8 %
HCT: 25.4 % — ABNORMAL LOW (ref 40.0–52.0)
HGB: 8.1 g/dL — AB (ref 13.0–18.0)
LYMPHS PCT: 11.9 %
Lymphocyte #: 0.7 10*3/uL — ABNORMAL LOW (ref 1.0–3.6)
MCH: 26.5 pg (ref 26.0–34.0)
MCHC: 32 g/dL (ref 32.0–36.0)
MCV: 83 fL (ref 80–100)
MONO ABS: 0.6 x10 3/mm (ref 0.2–1.0)
Monocyte %: 9.6 %
NEUTROS PCT: 75 %
Neutrophil #: 4.7 10*3/uL (ref 1.4–6.5)
Platelet: 345 10*3/uL (ref 150–440)
RBC: 3.07 10*6/uL — ABNORMAL LOW (ref 4.40–5.90)
RDW: 17 % — AB (ref 11.5–14.5)
WBC: 6.2 10*3/uL (ref 3.8–10.6)

## 2014-02-23 LAB — MAGNESIUM: Magnesium: 1.7 mg/dL — ABNORMAL LOW

## 2014-02-26 LAB — CBC WITH DIFFERENTIAL/PLATELET
Basophil #: 0.1 10*3/uL (ref 0.0–0.1)
Basophil %: 1.3 %
EOS PCT: 2.5 %
Eosinophil #: 0.2 10*3/uL (ref 0.0–0.7)
HCT: 27 % — ABNORMAL LOW (ref 40.0–52.0)
HGB: 8.5 g/dL — AB (ref 13.0–18.0)
LYMPHS ABS: 0.9 10*3/uL — AB (ref 1.0–3.6)
Lymphocyte %: 13.3 %
MCH: 26.1 pg (ref 26.0–34.0)
MCHC: 31.4 g/dL — AB (ref 32.0–36.0)
MCV: 83 fL (ref 80–100)
MONO ABS: 0.5 x10 3/mm (ref 0.2–1.0)
Monocyte %: 7.5 %
Neutrophil #: 4.9 10*3/uL (ref 1.4–6.5)
Neutrophil %: 75.4 %
PLATELETS: 308 10*3/uL (ref 150–440)
RBC: 3.24 10*6/uL — ABNORMAL LOW (ref 4.40–5.90)
RDW: 17.5 % — AB (ref 11.5–14.5)
WBC: 6.5 10*3/uL (ref 3.8–10.6)

## 2014-02-26 LAB — TSH: THYROID STIMULATING HORM: 1.06 u[IU]/mL

## 2014-02-26 LAB — BASIC METABOLIC PANEL
Anion Gap: 8 (ref 7–16)
BUN: 13 mg/dL (ref 7–18)
CHLORIDE: 105 mmol/L (ref 98–107)
CO2: 25 mmol/L (ref 21–32)
CREATININE: 1.03 mg/dL (ref 0.60–1.30)
Calcium, Total: 7.7 mg/dL — ABNORMAL LOW (ref 8.5–10.1)
EGFR (African American): >60
EGFR (Non-African Amer.): >60
Glucose: 259 mg/dL — ABNORMAL HIGH (ref 65–99)
Osmolality: 285 (ref 275–301)
Potassium: 4 mmol/L (ref 3.5–5.1)
SODIUM: 138 mmol/L (ref 136–145)

## 2014-02-26 LAB — MAGNESIUM: MAGNESIUM: 1.7 mg/dL — AB

## 2014-03-02 ENCOUNTER — Observation Stay: Payer: Self-pay | Admitting: Internal Medicine

## 2014-03-02 LAB — URINALYSIS, COMPLETE
BILIRUBIN, UR: NEGATIVE
Bacteria: NONE SEEN
Leukocyte Esterase: NEGATIVE
Nitrite: NEGATIVE
PH: 6 (ref 4.5–8.0)
SPECIFIC GRAVITY: 1.018 (ref 1.003–1.030)

## 2014-03-02 LAB — COMPREHENSIVE METABOLIC PANEL
ALBUMIN: 2.1 g/dL — AB (ref 3.4–5.0)
Alkaline Phosphatase: 2091 U/L — ABNORMAL HIGH
Anion Gap: 8 (ref 7–16)
BUN: 18 mg/dL (ref 7–18)
Bilirubin,Total: 0.2 mg/dL (ref 0.2–1.0)
CALCIUM: 7.9 mg/dL — AB (ref 8.5–10.1)
CHLORIDE: 105 mmol/L (ref 98–107)
CO2: 25 mmol/L (ref 21–32)
Creatinine: 1.37 mg/dL — ABNORMAL HIGH (ref 0.60–1.30)
EGFR (African American): >60
GFR CALC NON AF AMER: 54 — AB
GLUCOSE: 205 mg/dL — AB (ref 65–99)
Osmolality: 283 (ref 275–301)
Potassium: 4.2 mmol/L (ref 3.5–5.1)
SGOT(AST): 19 U/L (ref 15–37)
SGPT (ALT): 11 U/L — ABNORMAL LOW
Sodium: 138 mmol/L (ref 136–145)
TOTAL PROTEIN: 6.2 g/dL — AB (ref 6.4–8.2)

## 2014-03-02 LAB — CBC
HCT: 27.7 % — AB (ref 40.0–52.0)
HGB: 8.6 g/dL — ABNORMAL LOW (ref 13.0–18.0)
MCH: 25.8 pg — AB (ref 26.0–34.0)
MCHC: 31 g/dL — ABNORMAL LOW (ref 32.0–36.0)
MCV: 83 fL (ref 80–100)
PLATELETS: 234 10*3/uL (ref 150–440)
RBC: 3.33 10*6/uL — ABNORMAL LOW (ref 4.40–5.90)
RDW: 18 % — ABNORMAL HIGH (ref 11.5–14.5)
WBC: 7 10*3/uL (ref 3.8–10.6)

## 2014-03-03 LAB — TSH: Thyroid Stimulating Horm: 2.2 u[IU]/mL

## 2014-03-04 LAB — BASIC METABOLIC PANEL
Anion Gap: 9 (ref 7–16)
BUN: 14 mg/dL (ref 7–18)
CALCIUM: 7.6 mg/dL — AB (ref 8.5–10.1)
CO2: 24 mmol/L (ref 21–32)
CREATININE: 1.1 mg/dL (ref 0.60–1.30)
Chloride: 107 mmol/L (ref 98–107)
EGFR (African American): >60
EGFR (Non-African Amer.): >60
Glucose: 183 mg/dL — ABNORMAL HIGH (ref 65–99)
Osmolality: 285 (ref 275–301)
Potassium: 4.3 mmol/L (ref 3.5–5.1)
Sodium: 140 mmol/L (ref 136–145)

## 2014-03-04 LAB — URINE CULTURE

## 2014-03-18 ENCOUNTER — Ambulatory Visit: Payer: Self-pay | Admitting: Internal Medicine

## 2014-04-18 DEATH — deceased

## 2014-08-08 NOTE — Op Note (Signed)
PATIENT NAME:  [name], [name] MR#:  [id number] DATE OF BIRTH:  Mar 07, 1941  DATE OF PROCEDURE:  06/04/2012  PREOPERATIVE DIAGNOSES: 1. Gangrene of tips of multiple toes, worse on the left than the right.  2. Diabetes mellitus.   POSTOPERATIVE DIAGNOSES:  1. Gangrene of tips of multiple toes, worse on the left than the right.  2. Diabetes mellitus.   PROCEDURES PERFORMED:  1. Ultrasound guidance for vascular access, right femoral artery.  2. Aortogram and bilateral lower extremity arteriograms. 3. Catheter placement into left superficial femoral artery from right common femoral approach.  4. StarClose closure device, right femoral artery.   SURGEON: Annice NeedyJason S. Dew, M.Harris.   ANESTHESIA: Local with moderate conscious sedation.   ESTIMATED BLOOD LOSS: 25 mL.  FLUOROSCOPY TIME: Approximately 3 minutes.   INDICATION FOR PROCEDURE: This is a 74 year old male who had presented to the office with gangrene and pain of multiple toes. He had long-standing diabetes mellitus. Angiogram is performed for evaluation of his arterial system to see if major blood vessel issues were causing his problems. Risks and benefits were discussed and informed consent was obtained.   DESCRIPTION OF PROCEDURE: The patient was brought to the operative suite. His groins were shaved and prepped and a sterile surgical field was created. The ultrasound was used to visualize a patent right femoral artery. It was accessed under direct ultrasound guidance without difficulty with a Seldinger needle. A three J-wire and 5-French sheath was placed. Pigtail catheter was placed in the aorta, at the L1-L2 level, and AP aortogram was performed. This showed normal aorta and iliac systems without flow-limiting stenosis. There was some tortuosity in the iliac systems. There were single patent renal arteries bilaterally. I hooked the aortic bifurcation and advanced to the left femoral head. Selective left lower extremity angiogram was then  performed. His circulation time was extremely slow. Whether this was from a markedly reduced ejection fraction was not clear, but it required parking a catheter into the superficial femoral artery to visualize his tibial and vessels to the foot. Imaging was performed and showed a patent common femoral artery, profunda femoris artery, superficial femoral and popliteal artery. He had 2 vessels with continuous runoff into the foot, on the left. His posterior tibial artery was large well into the foot and there appeared to be only some pruning of the very small vessels within the foot. There was no large vessel involvement and intervention would be of no benefit and was not required at this point. The diagnostic catheter was then removed. Imaging was performed through the right femoral sheath, of the right lower extremity. This demonstrated nearly mirror image results with no common femoral, profunda femoris, superficial femoral or popliteal artery stenosis. He had normal tibial trifurcation and what appeared to be two-vessel runoff to the foot on the right as well, although his circulation time was slow and it was a little difficult to opacify. At this point, we elected to terminate the procedure. The sheath was removed. A StarClose closure device was deployed in the usual fashion with excellent hemostatic result. The patient tolerated the procedure well and was taken to the recovery room in stable condition.  ____________________________ Annice NeedyJason S. Dew, MD [email or web address] Harris: 06/04/2012 10:46:18 ET T: 06/04/2012 13:28:46 ET JOB#: [id number]  cc: Annice NeedyJason S. Dew, MD, <Dictator> Annice NeedyJASON S DEW MD ELECTRONICALLY SIGNED 06/04/2012 16:58

## 2014-08-08 NOTE — Consult Note (Signed)
Brief Consult Note: Diagnosis: Anxiety Do NOS.   Patient was seen by consultant.   Comments: Pt seen briefly in ED. He presented to ED as he had angioplasty done yesterday and he took his dressing early and was apprehensive about the small amount of bleeding. During my interview, pt was focused on being discharged and stated that he wants to go for his kidney appointment at 1245 pm. He stated that he is supposed be there early, and does not want to miss. He appeared anxious.  Stated that he takes Celexa prescribed by Dr Gearldine BienenstockJudali. Lives alone. Does not appear to have any acute paranoia, mood symptoms noted. Focused on being D/C from the ED.   Plan:  Discussed the case with ED physician and he can be discharged if he is medically stable as he is very apprehensive about his appointment.  No meds prescribed at this time.  Electronic Signatures: Rhunette CroftFaheem, Karan Ramnauth S (MD)  (Signed 18-Feb-14 11:43)  Authored: Brief Consult Note   Last Updated: 18-Feb-14 11:43 by Rhunette CroftFaheem, Nico Syme S (MD)

## 2014-08-08 NOTE — Consult Note (Signed)
PATIENT NAME:  [name], [name] MR#:  [id number] DATE OF BIRTH:  02-Apr-1941  DATE OF CONSULTATION:  01/01/2013  REFERRING PHYSICIAN:  Rebecka Apley, MD CONSULTING PHYSICIAN:  Ardeen Fillers. Garnetta Buddy, MD  REASON FOR CONSULTATION: Depression.   HISTORY OF PRESENT ILLNESS: The patient is a 74 year old separated white male who presented to the ED as he reported that he blood sugar was very high. He reported that he currently lives by himself, his wife of 46 years has left him and she is currently separated from him. The patient reported that his blood sugar always runs too high and his medications do not work. The patient reported that he has not been eating for the past 9 days and is hungry. He stated that he just feels bad and he has not bothered to eat.   During my interview, the patient was noted to be restless and was walking in the room. He reported that why I am there to talk to him. He reported that he does not feel depressed or anxious. He stated that he checks his blood sugar, as he is under the care of Dr. Dario Guardian. He reported that Dr. Dario Guardian usually checks on his blood sugar. He stated that he is too tired to take medications, as he has history of prostate cancer and now it is spreading to his bones. He stated that it is inoperable and he is getting medications for his prostate cancer. He stated that he is concerned about his 2 sons who are alcoholics and his wife has also left him. He has no contact with his family members. He reported that he does not take his medications regularly, as he does not feel that they are going to help him anymore. However, he denied having any suicidal ideations or plans. He reported that [facility] and follows with his physician on a regular basis. He reported that sometimes he feels tired, so he does not eat well. However, the staff in the ED and the physicians have reported that since he came to the hospital, he has been eating well and asking for  extra food. The patient denied having any suicidal ideations or plans. He denied having any perceptual disturbances.   PAST PSYCHIATRIC HISTORY: The patient reported that he was diagnosed with depression and he was given medication by Dr. Dario Guardian, his primary care physician. He does not remember the name of the medication. He stated that he does not want to see a psychiatrist on an outpatient basis, as he does not feel that he is severely depressed. He stated that he does not want to commit suicide, as he will "go to hell."   MEDICAL HISTORY: Congestive heart failure, status post prostate cancer and getting treatment for the same, tonsillectomy, hypothyroidism, hypertension, diabetes mellitus type 2, coronary artery disease, hiatal hernia, cardiac stent placement.   ALLERGIES: No known drug allergies.   FAMILY HISTORY: The patient reported that he was married for 46 years. He reported that he has 2 sons, but both are alcoholic and have problems with drug use. He reported that he tried to talk to them to stop using drugs, but nobody is listening to him. He stated that he feels sad about the same.   ANCILLARY DATA:  VITAL SIGNS: Temperature 98, pulse 82, respirations 20, blood pressure 170/84. LABORATORY DATA: Glucose 172, BUN 35, creatinine 1.55, sodium 131, potassium 5.1, chloride 102, bicarbonate 19, anion gap 10, osmolality 290. Protein 7.1, albumin 3.5, bilirubin 0.3, alkaline  phosphatase 251, AST 15, ALT 25. TSH 1.05. WBC 8.2, RBC 4.77, hemoglobin 14.5, hematocrit 40.2, MCV 84.   REVIEW OF SYSTEMS:    CONSTITUTIONAL: Denies any fever or chills. No weight changes.  EYES: No double or blurred vision.  RESPIRATORY: No shortness of breath or cough.  CARDIOVASCULAR: No chest pain or orthopnea.  GASTROINTESTINAL: No abdominal pain, nausea, vomiting or diarrhea.  GENITOURINARY: No incontinence or frequency.  ENDOCRINE: No heat or cold intolerance.  LYMPHATIC: No anemia or easy bruising.   INTEGUMENTARY: No acne or rash.  MUSCULOSKELETAL: No muscle or joint pain.  NEUROLOGIC: Denies any tingling or weakness.   MENTAL STATUS EXAMINATION: The patient is a moderately built male who appeared his stated age. He was somewhat restless during the interview. He was able to talk in a soft tone of voice. His mood was fine. Affect was congruent. Thought process was logical, goal-directed. Thought content was nondelusional. He currently denied having any suicidal ideations or plans. He denied having any perceptual disturbances. His memory appeared intact.   DIAGNOSTIC IMPRESSION: AXIS I: Depressive disorder, not otherwise specified.  AXIS II: None.  AXIS III: Please review the medical history.   TREATMENT PLAN: I discussed with the patient at length about the medication treatment risks, benefits and alternatives. However, he declined, as he does not want to take any medications at this time. He reported that he is doing well and will follow up with his primary care physician, Dr. Dario GuardianJadali, who is prescribing him medication for depression. The patient will continue on his medications for diabetes and for his cancer treatment prescribed. He was given information about following up with [facility] in the outpatient. He can be discharged safely if he is clinically stable, as he is not expressing any suicidal ideations.   Thank you for allowing me to participate in the care of this patient.   ____________________________ Ardeen FillersUzma S. Garnetta BuddyFaheem, MD [email or web address] D: 01/01/2013 16:12:32 ET T: 01/01/2013 17:46:31 ET JOB#: [id number]  cc: Ardeen FillersUzma S. Garnetta BuddyFaheem, MD, <Dictator> Rhunette CroftUZMA S Rhenda [place] MD ELECTRONICALLY SIGNED 01/08/2013 9:08

## 2014-08-09 NOTE — Consult Note (Signed)
CHIEF COMPLAINT and HISTORY:  Subjective/Chief Complaint Coffee ground emesis; consulted for gangrenous toes   History of Present Illness 74 year old male known to [facility] practice admitted yesterday with coffe ground emesis, hyperglycemia, stage IV metastatic prostate cancer, chronic kidney disease, gangrenous toes.  We were consulted for evaluation of gangrenous toes. He was seen in our office last week by Dr. Lucky Cowboy. Appears to have had some emoblic disease to the toes. At his office visit last week he did not wish to have amputation done. This morning he states that he "probably needs to have it done." He reports feeling terrible mentally this morning. He has some pain to the feet (left greater than right) which is not severe.   PAST MEDICAL/SURGICAL HISTORY:  Past Medical History:   chf:    Prostate Cancer:    Tonsillectomy:    Depression:    Hypothyroidism:    HTN:    DM Type 2:    CAD:    Hernia Repair:    Cardiac Stent:   ALLERGIES:  Allergies:  No Known Allergies:   HOME MEDICATIONS:  Home Medications: Medication Instructions Status  acetaminophen-hydrocodone 325 mg-5 mg oral tablet 1 tab(s) orally every 4 to 6 hours, As Needed Active  Xtandi 40 mg oral capsule 1 tab(s) orally once a day Active  glipiZIDE 5 mg oral tablet 1 tab(s) orally 2 times a day Active  pentoxifylline 400 mg oral tablet, extended release 1 tab(s) orally 2 times a day Active  folic acid 0.8 mg oral tablet 1 tab(s) orally once a day Active  ferrous sulfate 325 mg oral tablet 1 tab(s) orally once a day Active  ALPRAZolam 0.25 mg oral tablet 1 tab(s) orally 3 times a day Active  metFORMIN 1000 mg oral tablet 1 tab(s) orally 2 times a day, As Needed Active  aspirin 81 mg oral tablet 1 tab(s) orally once a day Active  omeprazole 20 mg oral delayed release capsule 1 cap(s) orally once a day Active  docusate sodium sodium 100 mg oral capsule 1 cap(s) orally 2 times a day Active  clopidogrel 75 mg  oral tablet 1 tab(s) orally once a day Active  Fish Oil 1000 mg oral capsule 1 cap(s) orally once a day Active  One Daily with Minerals Therapeutic Multiple Vitamins with Minerals oral tablet 1 tab(s) orally once a day Active   Family and Social History:  Family History Non-Contributory   Social History negative tobacco, negative ETOH, negative Illicit drugs   Place of Living Home   Review of Systems:  Fever/Chills No   Cough No   Abdominal Pain No   Nausea/Vomiting No   SOB/DOE No   Chest Pain No   ROS +fatigue   Physical Exam:  GEN no acute distress, Less anxious this morning   HEENT hearing intact to voice, moist oral mucosa   NECK supple  No masses   RESP normal resp effort  clear BS   CARD regular rate   VASCULAR ACCESS Palpable pedal pulses   ABD denies tenderness  soft  normal BS   LYMPH negative neck   SKIN Left toes all dry gangrene, right toes with gangrenous ulcerations as well   PSYCH A+O to time, place, person   LABS:  Laboratory Results: Hepatic:    23-Mar-15 23:02, Comprehensive Metabolic Panel  Bilirubin, Total 0.4  Alkaline Phosphatase 831  45-117  NOTE: New Reference Range  03/08/13  SGPT (ALT) 11  SGOT (AST) 16  Total Protein, Serum 6.4  Albumin, Serum 2.5    24-Mar-15 09:36, Bilirubin, Direct  Bilirubin, Direct 0.2  Result(s) reported on 09 Jul 2013 at 02:54PM.    24-Mar-15 09:36, Comprehensive Metabolic Panel  Bilirubin, Total 1.2  Alkaline Phosphatase 681  45-117  NOTE: New Reference Range  03/08/13  SGPT (ALT) 14.  SGOT (AST) 32  Total Protein, Serum 6.0  Albumin, Serum 2.3  Routine BB:    23-Mar-15 23:02, Crossmatch 2 Units  Crossmatch Unit 1   Transfused  Crossmatch Unit 2   Transfused   Result(s) reported on 10 Jul 2013 at [facility].    23-Mar-15 23:02, Type and Antibody Screen  ABO Group + Rh Type   A Positive  Antibody Screen NEGATIVE  Result(s) reported on 09 Jul 2013 at 12:49AM.  Cardiology:     23-Mar-15 23:27, ED ECG  Ventricular Rate 112  Atrial Rate 112  P-R Interval 132  QRS Duration 80  QT 338  QTc 461  P Axis 36  T Axis -21  ECG interpretation   Sinus tachycardia  Possible Left atrial enlargement  ST & T wave abnormality, consider lateral ischemia  Abnormal ECG  When compared with ECG of 04-May-2012 10:21,  Vent. rate has increased BY  38 BPM  Non-specific change in ST segment in Anterior leads  Nonspecific T wave abnormality now evident in Inferior leads  ----------unconfirmed----------  Confirmed by OVERREAD, NOT ([id number]), editor PEARSON, BARBARA ([id number]) on 07/09/2013 2:55:40 PM  ED ECG   Routine Chem:    23-Mar-15 23:02, Comprehensive Metabolic Panel  Glucose, Serum 442  BUN 20  Creatinine (comp) 1.68  Sodium, Serum 130  Potassium, Serum 4.3  Chloride, Serum 100  CO2, Serum 23  Calcium (Total), Serum 8.6  Osmolality (calc) 282  eGFR (African American) 46  eGFR (Non-African American) 40  eGFR values <71mL/min/1.73 m2 may be an indication of chronic  kidney disease (CKD).  Calculated eGFR is useful in patients with stable renal function.  The eGFR calculation will not be reliable in acutely ill patients  when serum creatinine is changing rapidly. It is not useful in   patients on dialysis. The eGFR calculation may not be applicable  to patients at the low and high extremes of body sizes, pregnant  women, and vegetarians.  Anion Gap 7    23-Mar-15 23:02, Lipase  Lipase 112  Result(s) reported on 08 Jul 2013 at 11:49PM.    24-Mar-15 09:36, Comprehensive Metabolic Panel  Glucose, Serum 228  BUN 19  Creatinine (comp) 1.63  Sodium, Serum 135  Potassium, Serum 4.2  Chloride, Serum 104  CO2, Serum 24  Calcium (Total), Serum 8.6  Osmolality (calc) 280  eGFR (African American) 48  eGFR (Non-African American) 41  eGFR values <59mL/min/1.73 m2 may be an indication of chronic  kidney disease (CKD).  Calculated eGFR is useful in patients with stable renal  function.  The eGFR calculation will not be reliable in acutely ill patients  when serum creatinine is changing rapidly. It is not useful in   patients on dialysis. The eGFR calculation may not be applicable  to patients at the low and high extremes of body sizes, pregnant  women, and vegetarians.  Anion Gap 7    24-Mar-15 09:36, Hemoglobin A1c ([facility])  Hemoglobin A1c ([facility]) 10.3  The American Diabetes Association recommends that a primary goal of  therapy should be <7% and that physicians should reevaluate the  treatment regimen in patients with HbA1c values consistently >8%.    25-Mar-15 44:96, Basic Metabolic  Panel (w/Total Calcium)  Glucose, Serum 227  BUN 18  Creatinine (comp) 1.53  Sodium, Serum 131  Potassium, Serum 4.2  Chloride, Serum 103  CO2, Serum 21  Calcium (Total), Serum 8.6  Anion Gap 7  Osmolality (calc) 272  eGFR (African American) 52  eGFR (Non-African American) 45  eGFR values <13mL/min/1.73 m2 may be an indication of chronic  kidney disease (CKD).  Calculated eGFR is useful in patients with stable renal function.  The eGFR calculation will not be reliable in acutely ill patients  when serum creatinine is changing rapidly. It is not useful in   patients on dialysis. The eGFR calculation may not be applicable  to patients at the low and high extremes of body sizes, pregnant  women, and vegetarians.  Cardiac:    23-Mar-15 23:02, CK, Total  CK, Total 128  39-308  NOTE: NEW REFERENCE RANGE   05/20/2013    23-Mar-15 23:02, Troponin I  Troponin I 0.04  0.00-0.05  0.05 ng/mL or less: NEGATIVE   Repeat testing in 3-6 hrs   if clinically indicated.  >0.05 ng/mL: POTENTIAL   MYOCARDIAL INJURY. Repeat   testing in 3-6 hrs if   clinically indicated.  NOTE: An increase or decrease   of 30% or more on serial   testing suggests a   clinically important change  Routine UA:    24-Mar-15 00:35, Urinalysis  Color (UA) Yellow  Clarity (UA) Hazy  Glucose (UA)  >=500  Bilirubin (UA) Negative  Ketones (UA) Negative  Specific Gravity (UA) 1.026  Blood (UA) Negative  pH (UA) 5.0  Protein (UA) Negative  Nitrite (UA) Negative  Leukocyte Esterase (UA) Negative  Result(s) reported on 09 Jul 2013 at 01:19AM.  RBC (UA)   NONE SEEN  WBC (UA) <1 /HPF  Bacteria (UA)   NONE SEEN  Epithelial Cells (UA)   NONE SEEN  Mucous (UA) PRESENT  Result(s) reported on 09 Jul 2013 at 01:19AM.  Routine Coag:    23-Mar-15 23:02, Prothrombin Time  Prothrombin 15.3  INR 1.2  INR reference interval applies to patients on anticoagulant therapy.  A single INR therapeutic range for coumarins is not optimal for all  indications; however, the suggested range for most indications is  2.0 - 3.0.  Exceptions to the INR Reference Range may include: Prosthetic heart  valves, acute myocardial infarction, prevention of myocardial  infarction, and combinations of aspirin and anticoagulant. The need  for a higher or lower target INR must be assessed individually.  Reference: The Pharmacology and Management of the Vitamin K   antagonists: the seventh ACCP Conference on Antithrombotic and  Thrombolytic Therapy. DXIPJ.8250 Sept:126 (3suppl): [id number].  A HCT value >55% may artifactually increase the PT.  In one study,   the increase was an average of 25%.  Reference:  "Effect on Routine and Special Coagulation Testing Values  of Citrate Anticoagulant Adjustment in Patients with High HCT Values."  American Journal of Clinical Pathology 2006;126:400-405.  Routine Hem:    23-Mar-15 23:02, Hemogram, Platelet Count  WBC (CBC) 12.3  RBC (CBC) 3.42  Hemoglobin (CBC) 9.2  Hematocrit (CBC) 28.2  Platelet Count (CBC) 369  Result(s) reported on 08 Jul 2013 at 11:37PM.  MCV 83  MCH 27.0  MCHC 32.8  RDW 13.9    24-Mar-15 09:36, Hemoglobin  Hemoglobin (CBC) 10.7  Result(s) reported on 09 Jul 2013 at 09:55AM.    24-Mar-15 17:39, Hemoglobin  Hemoglobin (CBC) 10.4  Result(s)  reported on 09 Jul 2013 at 05:56PM.  25-Mar-15 05:20, CBC Profile  WBC (CBC) 7.5  RBC (CBC) 4.00  Hemoglobin (CBC) 11.4  Hematocrit (CBC) 33.5  Platelet Count (CBC) 322  MCV 84  MCH 28.6  MCHC 34.2  RDW 14.8  Neutrophil % 77.4  Lymphocyte % 10.5  Monocyte % 8.6  Eosinophil % 2.7  Basophil % 0.8  Neutrophil # 5.8  Lymphocyte # 0.8  Monocyte # 0.6  Eosinophil # 0.2  Basophil # 0.1  Result(s) reported on 10 Jul 2013 at 06:18AM.   RADIOLOGY:  Radiology Results: XRay:    23-Mar-15 23:01, Chest 1 View AP or PA  Chest 1 View AP or PA  REASON FOR EXAM:    emesis, vomiting blood  COMMENTS:   May transport without cardiac monitor    PROCEDURE: DXR - DXR CHEST 1 VIEWAP OR PA  - Jul 08 2013 11:01PM     CLINICAL DATA:  Hematemesis.    EXAM:  CHEST - 1 VIEW    COMPARISON:  CT ANGIO CHESTdated 07/02/2013; DG CHEST 2V dated  01/01/2013    FINDINGS:  The cardiomediastinal silhouette is within normal limits. There is  mild motion artifact. The patient has taken a shallower inspiration  than on the prior radiograph. Mild opacity is presentin the left  lower lobe. The right lung is grossly clear. No pleural effusion or  pneumothorax is identified. Numerous sclerotic osseous lesions are  again seen.     IMPRESSION:  Minimal opacity in the left lower lobe, possibly atelectasis.      Electronically Signed    By: Logan Bores    On: 07/08/2013 23:06       Verified By: Ferol Luz, M.D.,  [place]:    17-Mar-15 14:12, CT Angiography Chest  PACS Image    23-Mar-15 23:01, Chest 1 View AP or PA  PACS Image  CT:    17-Mar-15 14:12, CT Angiography Chest  CT Angiography Chest  REASON FOR EXAM:    LABS 1ST  emboli    METFORMIN  COMMENTS:       PROCEDURE: CT  - CT ANGIOGRAPHY CHEST W/CONTRAST  - Jul 02 2013  2:12PM     CLINICAL DATA:  Prostate cancer    EXAM:  CT ANGIOGRAPHY CHEST WITH CONTRAST    TECHNIQUE:  Multidetector CT imaging of the chest was performed using  the  standard protocol during bolus administration of intravenous  contrast. Multiplanar CT image reconstructions and MIPs were  obtained to evaluate the vascular anatomy.  CONTRAST:  75 cc Isovue 370    COMPARISON:  None.    FINDINGS:  There are no filling defects in the pulmonary arterial tree to  suggest acute pulmonary thromboembolism.    Maximal ascending aortic diameter is 3.8 cm. No evidence of  dissection. Mild atherosclerotic changes of the proximal great  vessels. Three vessel coronary artery calcifications are noted.    No abnormal mediastinal adenopathy or pericardial effusion. Thyroid  is mildly heterogeneous and normal in size.  No pneumothorax. No pleural effusion. Left upper lobe calcified  granuloma on image 33 of series 6.    Widespread metastatic predominately sclerotic bone disease is  present. Some lesions have a non sclerotic matrix and extend beyond  the confines of the bone. This includes expansile lesions at the  inferior tip of the scapula, an 8 left posterior lateral rib lesion  on image 92. No obvious pathologic fracture. Mild wedging of T12 has  a chronic appearance. There is no obvious epidural tumor in  the  thoracic spine.    Upper abdomen is benign. Cystic area within the central left kidney  is nonspecific.    Review of the MIP images confirms the above findings.   IMPRESSION:  No evidence of acute pulmonary thromboembolism.    Widespread metastatic osseous disease including at least 2 expansile  bone lesions in a left posterior lateral rib and the inferior tip of  the left scapula.      Electronically Signed    By: Maryclare Bean M.D.    On: 07/02/2013 15:16         Verified By: Jamas Lav, M.D.,   ASSESSMENT AND PLAN:  Plan Gangrenous changes to all the toes on the left foot and would likely require some sort of transmetatarsal amputation to remove this tissue eventually. Also gangrenous changes of the right toes. Patient is now  considering amputation. He has previously seen Dr. Cleda Mccreedy and wishes for him to do any amputation. Recommend consult to Dr. Cleda Mccreedy per patient wishes.  From vascular standpoint- arterial noninvasive studies have been done at our office which showed no significant stenosis of bilateral lower extremities. He has normal ABIs and no significant large vessel stenosis is present on duplex or previous angiogram, and I do not think there is a role for in an angiogram at this point. Discussed with Dr. Lucky Cowboy.  Thank you for this consultation.   Electronic Signatures: Onda Kattner, Magazine features editor (PA-C)  (Signed 25-Mar-15 08:52)  Authored: Chief Complaint and History, PAST MEDICAL/SURGICAL HISTORY, ALLERGIES, HOME MEDICATIONS, Family and Social History, Review of Systems, Physical Exam, LABS, RADIOLOGY, Assessment and Plan   Last Updated: 25-Mar-15 08:52 by Su Grand (PA-C)

## 2014-08-09 NOTE — Discharge Summary (Signed)
PATIENT NAME:  [name], Armin D MR#:  [id number] DATE OF BIRTH:  [birth year]  DATE OF ADMISSION:  03/02/2014 DATE OF DISCHARGE:  03/06/2014  DISPOSITION: The patient was discharged to hospice home.  DIAGNOSIS: Prostate cancer with bone metastases and chronic pain.   MEDICATION ON DISCHARGE:  1.  Pentoxifylline 400 mg oral tablet 2 times a day.  2.  Citalopram 40 mg oral tablet once a day.  3.  Tamsulosin 0.4 mg oral capsule once a day.  4.  Zolpidem 5 mg oral tablet once a day.  5.  Dronabinol 2.5 mg oral capsule 2 times a day.  6.  Mirtazapine 45 mg oral tablet once a day.  7.  Aripiprazole 5 mg oral tablet once a day. 8.  Quetiapine 25 mg oral tablet once a day.  9.  Magnesium oxide 400 mg once a day.  10.  Polyethylene glycol 17 g oral once a day.  11.  Docusate sodium 100 mg oral capsule 2 times a day as needed for stool softener.  12.  Megestrol 40 mg/mL oral suspension, 10 mL oral 2 times a day.  14.  Oxycodone 10 mg oral every 12 hours.  15.  Morphine 20 mg concentrated solution 0.25 mL to 0.5 mL every 1 to 2 hours as needed for pain or dyspnea.  16.  Zofran 4 mg oral tablet every 6 hours as needed for nausea and vomiting.   HISTORY OF PRESENTING ILLNESS AND BRIEF COURSE OF HOSPITAL STAY: A 74 year old Caucasian male with a history of metastatic prostate cancer who was admitted multiple times in the past few months because of complaining of urinary symptoms and severe pain and failure to thrive. This time, also, he came with pain and agitation. He was given Dilaudid in ER for pain control and admitted to medical services. After admission, as he had recurrent visits for similar complaints, palliative consult was called in, and finally patient signed for DNR and comfort care and hospice services, so planning initiated for hospice services and he was discharged over there once it was arranged.   CONSULTATION IN THE HOSPITAL: Ned GraceNancy Phifer, MD  TOTAL TIME SPENT ON THIS DISCHARGE: 35  minutes.    ____________________________ Hope PigeonVaibhavkumar G. Elisabeth PigeonVachhani, MD vgv:ST D: 03/10/2014 00:01:07 ET T: 03/10/2014 00:42:29 ET JOB#: [id number]  cc: Hope PigeonVaibhavkumar G. Elisabeth PigeonVachhani, MD, <Dictator> Altamese DillingVAIBHAVKUMAR Rosali Augello MD ELECTRONICALLY SIGNED 03/17/2014 8:54

## 2014-08-09 NOTE — Consult Note (Signed)
Brief Consult Note: Diagnosis: Depression.   Recommend further assessment or treatment.   Comments: Pt was referred to Palliative Care due to his Terminal Cancer and for further care and management issues.  Electronic Signatures: Rhunette CroftFaheem, Celia Gibbons S (MD)  (Signed 12-Oct-15 11:23)  Authored: Brief Consult Note   Last Updated: 12-Oct-15 11:23 by Rhunette CroftFaheem, Tahara Ruffini S (MD)

## 2014-08-09 NOTE — Consult Note (Signed)
PATIENT NAME:  [name], [name] MR#:  [id number] DATE OF BIRTH:  Sep 03, 1940  DATE OF CONSULTATION:  07/16/2013  REFERRING PHYSICIAN:  Hilda Lias, MD CONSULTING PHYSICIAN:  Sederick Jacobsen B. Jennet Maduro, MD  The patient was consulted on March 30th and March 31st.  REASON FOR CONSULTATION: To assess the capacity to make medical decisions in a suicidal patient.   IDENTIFYING DATA: [name] is a 74 year old male with history of depression and anxiety.   CHIEF COMPLAINT: "I'd rather be dead, but I am not going to kill myself because I don't want to go to the hot hell."   HISTORY OF PRESENT ILLNESS: [name] was admitted to the medical floor with coffee-ground emesis. He was discovered to have gangrene of left foot and required amputation of fifth and second toes on the right foot as well. He is unable to take care of himself and needs to be placed in a skilled nursing facility at least temporarily. The patient was seen by 2 psychiatrists already, Dr. Guss Bunde and Dr. Garnetta Buddy. He did not agree to take any medications and was unable or unwilling to have a discussion about his disposition. He agreed to talk to me again. Even though initially he is marginally cooperative he warms up during the interview. The patient reports that he has been increasingly depressed, angry and disappointed as his physical health is deteriorating. He is faced with financial difficulties and has not been able to pay his bills. He tells me that he would accept medication under 1 condition, that it is not expensive and will be covered. He also is upset with his daughter who is trying to have his house condemned. He is unhappy with Dr. Doylene Canning and blames him for dissemination of prostate cancer. He is unhappy with any other providers, very critical, but unable to name any of them. He is particularly unhappy with the psychiatrist who interviewed him in the past. He seems to be more agreeable to plan to go to skilled nursing facility. He  spoke with Frederico Hamman our chief social worker and expressed some understanding of his situation and willingness to go to skilled nursing facility. Delice Bison did send referrals. He will need a level 2 skilled nursing facility and is awaiting for [facility]. The patient is initially short, angry, dismissive, argumentative, irritable. However, at the end he is able to talk some about his situation. When I reminded him that in the past, in 2010, he was prescribed Zoloft and Xanax by his primary physician, Dr. Dario Guardian, he agreed to try something for depression and chose Zoloft. He remembered that when his wife left him in 2009 or 2010 he became acutely depressed for 10 months. He never had thoughts of hurting himself or others, but developed severe anxiety with panic attacks for which Zoloft and Xanax were prescribed. He then sought help in the Emergency Room and was seen by Dr. Maryruth Bun when he tried to discontinue the Xanax. One recommendation of consulting psychiatrist was to find a provider in the community, but the patient never did so. He is currently on no antidepressant and has been taking low-dose of Xanax 0.25 mg 3 times a day with little relief. The patient does understand that he will no longer be able to leave at his house. He agrees that he needs help, at least initially, but is extremely worried about his finances. He feels a little better about his prospect of transfer to skilled nursing facility when told that it most likely will be covered by Medicaid.  He is angry at his daughter who has been trying to convince him to see his doctors, but the patient was reluctant to do so. It is quite possible that it was in fear of being prescribed new medications that he could not afford. In fact his daughter almost forced him to come to the hospital on the day when he was vomiting blood. He does not allow me to talk to his daughter, but there is plenty of information in the chart that suggests that she has been very supportive  and trying to take best care of her father who has not been cooperating. She denies any symptoms of depression, anxiety, psychosis, symptoms suggestive of bipolar mania or substance use on direct questioning. He is visibly upset, angry, depressed. He has been talking about dying. He adamantly denies that he has ever said anything about hurting himself or others, but he would rather die than to face multiple difficult problems of the future. He hopes that God will take him away and that he will not be suffering any longer. When I read the notes of surgeons, after the procedure, apparently the patient is pretty upbeat, has very few complaints and has been following all instructions to the letter. The patient has a sitter in the room as there were worries about his safety. He tells me that he would never do anything and never in the hospital so he feels that the sitter is unnecessary. The sitters do not report any unsafe behaviors, except for talking about dying or rather be dead. The patient points out that this is just figurative speech, especially when upset.   PAST PSYCHIATRIC HISTORY: There is no psychiatric history until 2010, then he was treated briefly with Zoloft and Xanax following separation from his wife. There were no suicide attempts.   FAMILY PSYCHIATRIC HISTORY: None reported.   PAST MEDICAL HISTORY: Disseminated prostate cancer followed by Dr. Doylene Canninghoksi, diabetes, gangrene of left forefoot and status post amputation of fifth and second toes on the right foot, hypertension, coronary artery disease status post stent placement.   ALLERGIES: No known drug allergies.   MEDICATIONS ON ADMISSION: Reportedly the patient was noncompliant. Aspirin 81 mg daily, Plavix 75 mg daily, Lortab 5/325 every 4 to 6 hours as needed, glipizide 5 mg twice daily, metformin 1000 mg twice daily, alprazolam 0.25 mg 3 times daily, ferrous sulfate 325 mg daily, Colace 100 mg daily, pentoxifylline 400 mg twice daily, fish  oil 1000 mg daily, omeprazole 20 mg daily, multivitamin daily, folic acid 0.8 mg daily.   MEDICATIONS AT TIME OF CONSULTATION: Xanax 0.5 mg every 6 hours as needed, Dulcolax as needed, sliding scale insulin, morphine injection as needed, Zofran injection as needed, Protonix 40 mg twice daily, MiraLax 17 grams as needed for constipation, senna 1 tablet twice daily, glipizide 5 mg twice daily, Augmentin 875 mg twice daily.   SOCIAL HISTORY: He used to live by himself. He knows that he will not return to his house as it may be condemned. He has been taking care of himself up until recent worsening of his general health, driving a car, feeding himself. Apparently the house is unlivable and the patient seems to be a hoarder. He is now divorced, but had been married for 47 years. He has several children and 1 of his daughters, [name], is helping him. There is no healthcare power of attorney and the patient is FULL code.   REVIEW OF SYSTEMS: CONSTITUTIONAL: No fevers or chills. Positive for fatigue and  gradual weight loss.  EYES: No double or blurred vision.  ENT: No hearing loss.  RESPIRATORY: No shortness of breath or cough.  CARDIOVASCULAR: No chest pain or orthopnea.  GASTROINTESTINAL: No abdominal pain, nausea, vomiting, or diarrhea.  GENITOURINARY: No incontinence or frequency.  ENDOCRINE: No heat or cold intolerance.  LYMPHATIC: No anemia or easy bruising.  INTEGUMENTARY: No acne or rash.  MUSCULOSKELETAL: Status post right toe amputation and gangrene of left foot.  NEUROLOGIC: No tingling or weakness.  PSYCHIATRIC: See history of present illness for details.   PHYSICAL EXAMINATION: VITAL SIGNS: Blood pressure 151/81, pulse 68, respirations 20, temperature 98.  GENERAL: This is a slender elderly gentleman in no acute distress. The rest of the physical examination is deferred to his primary attending.   LABORATORY DATA: Glucose 192, BUN 21, creatinine 1.62, sodium 133, potassium 4.1. LFTs:  Total protein 6, albumin 2.3, bilirubin 1.2, alkaline phosphatase 681, AST and ALT within normal limits. CBC: White blood count 7.5, hemoglobin 11.4, hematocrit 33.5, platelets 322. Urinalysis is not suggestive of urinary tract infection.   MENTAL STATUS EXAMINATION: The patient is alert and oriented to person, place, time and situation. He is initially irritable and marginally cooperative, but comes around towards the end of the interview. He maintains good eye contact. He is marginally groomed but he is bed bound and not interested in his looks. He speech is loud at times. His mood is mad with labile affect. Thought process is logical with its own logic. He endorses passive suicidal ideations but adamantly denies that he would take any action. He has no intention or plan of hurting himself as he is a good [place] and does not want to be punished with eternal hell. He seems to indulge in all these statements about not wanting to be around, God taking him away or not waking up. Apparently he made these type of statements to everybody. In addition, he reportedly threatened his daughter on the phone, but he denies that. There are no delusions or paranoia. There are no auditory or visual hallucinations. His cognition is grossly intact. He is a good historian. He declined counting or spelling. He seems of normal intelligence and good fund of knowledge. His insight and judgment seem to improve.   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  AXIS II: Deferred.  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  AXIS IV: Medical and mental illness, loss of way of life.  AXIS V: Global assessment of functioning 55.   PLAN: 1.  The patient  agrees to start a SSRI for depression. Will start Zoloft tonight. He is familiar with the medication, wants to make sure that it is affordable. We will continue Xanax. I may need to change it to standing dose rather than p.r.n. as it is unlikely that the patient ever gets it given his demeanor.  2.  The patient does have the capacity to make decisions about his treatment. He refused EGD, but agreed to necessary surgery. He is cooperating with Frederico Hamman, our social worker, on placement.  3.  I will follow up.  ____________________________ Braulio Conte B. Jennet Maduro, MD jbp:sb D: 07/16/2013 14:28:59 ET T: 07/16/2013 15:03:13 ET JOB#: [id number]  cc: Dacota Ruben B. Jennet Maduro, MD, <Dictator> Shari Prows MD ELECTRONICALLY SIGNED 07/20/2013 5:30

## 2014-08-09 NOTE — Discharge Summary (Signed)
PATIENT NAME:  [name], [name] MR#:  [id number] DATE OF BIRTH:  [birth year]  DATE OF ADMISSION:  01/07/2014 DATE OF DISCHARGE:  01/13/2014   PRIMARY CARE PHYSICIAN: Marlyn CorporalFayegh H. Jadali, MD   DISCHARGE DIAGNOSES: Major depression, generalized weakness, hypoxia, prostate cancer, hypertension, diabetes, coronary artery disease, peripheral vascular disease.   CONDITION: Stable.   CODE STATUS: Full Code.   HOME MEDICATIONS: Please refer to the medication reconciliation list.   DIET: Low-sodium, low-fat, low-cholesterol diet.   ACTIVITY: As tolerated.   FOLLOWUP CARE: Follow up with PCP within 1-2 weeks. Follow up with Audery AmelJohn T. Clapacs, MD and Caralyn Guileichard Harris. Hart, DO in 1-2 weeks.   REASON FOR ADMISSION: Weakness, hypoxia, and hyponatremia.   HOSPITAL COURSE: The patient is a 74 year old gentleman with a history of metastatic prostate cancer, CAD, hypertension, PVD, CHF, diabetes, and kidney stones, who was sent to the ED due to a fall in his home. The patient's main complaint was lack of appetite and weakness. His vital signs revealed mild hypoxia in the ED. For detailed history and physical examination, please refer to the admission note dictated by Dr. Sheryle Hailiamond.  1.  Generalized weakness. After admission, the patient was on precautions with physical therapy. The patient needs skilled nursing facility for PT according to the PT evaluation.  2.  Hypoxia resolved, after treatment with oxygen. The patient is off oxygen.  3.  Hyponatremia, improved.  4.  Prostate cancer with recent bony metastasis, stable.  5.  Hypertension, controlled with Coreg.  6.  Major depression. The patient has depression and poor appetite. According to Dr. Guss Bundehalla, behavioral medicine physician's recommendations, the patient is on Remeron now. According to the psychiatry consult, the patient is not a candidate for inpatient or geriatric psych inpatient care.   The patient does still have generalized weakness, appetite is better  and depression is better. Vital signs are stable. He is clinically stable. He will be discharged to a skilled nursing facility after getting authorization. We discussed the patient's discharge plan with the patient, nurse, case manager and Child psychotherapistsocial worker.   TIME SPENT: About 38 minutes.    ____________________________ Shaune PollackQing Zya Finkle, MD qc:MT Harris: 01/13/2014 13:23:34 ET T: 01/13/2014 13:46:56 ET JOB#: [id number]  cc: Shaune PollackQing Hasson Gaspard, MD, <Dictator> Shaune PollackQING Navdeep Halt MD ELECTRONICALLY SIGNED 01/13/2014 15:30

## 2014-08-09 NOTE — Op Note (Signed)
PATIENT NAME:  [name], [name] MR#:  [id number] DATE OF BIRTH:  1941-03-27  DATE OF PROCEDURE:  07/12/2013  PREOPERATIVE DIAGNOSES: 1.  Gangrene all toes, left foot.  2.  Osteomyelitis, second and fifth toes, right foot.  POSTOPERATIVE DIAGNOSES: 1.  Gangrene all toes, left foot.  2.  Osteomyelitis, second and fifth toes, right foot.  PROCEDURES: Include: 1.  Transmetatarsal amputation, left foot.  2.  Amputation to the metatarsophalangeal joint level, second and fifth toes, right foot.   SURGEON: Epimenio Sarin, DPM.   ASSISTANT: None.   HISTORY OF PRESENT ILLNESS: The patient has been cared for by my partner Dr. Alberteen Spindle for a number of months. Dr. Alberteen Spindle has been trying to get him to come into the hospital to have this surgery done for quite a while. He has been suffering with gangrenous toes to his left foot and osteomyelitis in the toes to his right foot now for several weeks, if not months. He has had vascular procedures done and he needs to go ahead and have this done before he becomes unstable with infection and sepsis. He has been somewhat depressive about it but we tried to encourage him that this is the best thing to do, then he will function fairly well once he heals up from these.  ANESTHESIA:  General.  BLOOD LOSS:  Negligible, less than 25 mL.   HEMOSTASIS: Ankle tourniquet 250 mmHg for 12 minutes on the left foot and 10 minutes on the right foot. The tourniquet was released prior to closing up the wounds.   OPERATIVE REPORT: The patient was brought to the OR and placed on the OR table in the supine position. At this point after general anesthesia was achieved by the anesthesia team, attention was directed to the left foot and the right foot too, which were prepped and draped in the usual sterile manner. Attention was initially directed to the left foot where a tourniquet was elevated and the TMA amputation margin was marked and then a #10 blade was used to incise the skin  down to bone. The tissue was freed away from dorsal aspects of the metatarsal heads. Power equipment was used to resect behind the metatarsal heads proximal to the metatarsal heads in a normal metatarsal parabola pattern. Once this was completed, the remainder of the distal foot was removed leaving as much of the soft tissue plantarly and dorsally as possible. This was sent to pathology for evaluation. Copious irrigation was achieved at this point. The tourniquet was released and they were not any real arterial bleeders, so 3-0 Vicryl at this time was used to close subcutaneous tissue. Deep and superficial fascia was closed with simple interrupted sutures of this and then the skin was closed with 3-0 nylon simple interrupted sutures. A dog ear was removed from the plantar flap and this was also closed with 3-0 nylon simple interrupted sutures. Good apposition of the amputation site was achieved. A towel was draped around this foot and attention was then directed to the right foot where the tourniquet was elevated and 2 semielliptical incisions were made around the base of the second toe leaving enough good tissue on each medial and lateral side to get good closure across the amputation site. This was also done on the fifth toe in a similar fashion. The incision was carried down deep to bone. The toe was articulated to the MTP joint and removed in toto.  The same thing was achieved on the fifth toe of the  right foot. After copious irrigation, the tourniquet was released. No overt arterial bleeders were noted. The area was copiously irrigated some more and subcutaneous tissue was then closed with 3-0 Vicryl in simple interrupted stitch. The skin was closed in both areas with a combination of 3-0 nylon, 4-0 nylon simple interrupted sutures. Both areas close nicely.   At this time, the right foot was dressed with Xeroform gauze, 4 x 4's, Kling and Kerlix. The left foot had a wound VAC placed across the incision margin  by me and set 125 mmHg continuous pressure. A Kerlix was placed around the area once this was seen to be working appropriately. At this time, the patient left the OR for the recovery room with vital signs stable and neurovascular status intact.   ____________________________ Rhona RaiderMatthew G. Oluwatosin Bracy, DPM mgt:ce D: 07/12/2013 14:46:47 ET T: 07/12/2013 15:00:02 ET JOB#: [id number]  cc: Rhona RaiderMatthew G. Tinita Brooker, DPM, <Dictator> Epimenio SarinMATTHEW G Deanndra Kirley MD ELECTRONICALLY SIGNED 08/13/2013 12:50

## 2014-08-09 NOTE — Consult Note (Signed)
Details:   - GI Note:  I have seen and examined [name] and agree with Wilhelmenia BlaseKaryn Earle's a/p.    Hematemeisis, stable Hgb.     Currently, Rickey. Laural Harris is refusing upper endoscopy.  He seems to have decision making capacity and undertands the risk of not undergiong the procedure ( bleeding, death).   He is concerned about the risks mainly.   I expressed that I believe the benefits strongly outweight the risks.    Plan: - clear liquids - Protonix 40 mg IV q12.  - monitor Hgb - h pylori serology.  - EGD if he changes his mind.  - d/c plavix if able to medically.  - thank you for the consult.   Electronic Signatures: Dow Adolphein, [name] (MD)  (Signed 24-Mar-15 15:17)  Authored: Details   Last Updated: 24-Mar-15 15:17 by Dow Adolphein, Eeva Schlosser (MD)

## 2014-08-09 NOTE — Consult Note (Signed)
PSychiatry: PAtient seen today. He was back to being grumpy and unhappy today although staff report he was calmer. No specific complaint. Sounds like placement felll through so they are still looking for a place. Continue meds including abilify. Will follow.  Electronic Signatures: Tatyana Biber, Jackquline DenmarkJohn T (MD)  (Signed on 13-Nov-15 00:08)  Authored  Last Updated: 13-Nov-15 00:08 by Audery Amellapacs, Zemirah Krasinski T (MD)

## 2014-08-09 NOTE — Consult Note (Signed)
PATIENT NAME:  [name], [name] MR#:  [id number] DATE OF BIRTH:  Jul 22, 1940  DATE OF CONSULTATION:  07/10/2013  CONSULTING PHYSICIAN:  Linus Galasodd Rico Massar, DPM  REPORT OF CONSULTATION:  This is a 74 year old male, known to me outpatient, who has had a history of some progressive gangrenous changes to the toes on his left foot. The patient has been seen by Vascular as well. I have had multiple conversations with him by phone outpatient, where he was hesitant to come into the hospital to have anything done about his toes. Finally admitted recently with coffee ground emesis, as well as for evaluation of his gangrenous foot.   PAST MEDICAL HISTORY: 1.  History of stage IV prostate cancer, metastatic.  2.  Diabetes mellitus.  3.  Congestive heart failure.  4.  Depression. 5.  Hypothyroid.  6.  Hypertension.  7.  Coronary artery disease.   SURGICAL HISTORY: Tonsillectomy, cardiac stents, hernia repair.   MEDICATIONS: 1.  Aspirin 81 mg oral daily.  2.  Plavix 75 mg oral daily.  3.  Lortab 5/325 every 4 to 6 hours p.r.n.  4.  Glipizide 5 mg oral b.i.Harris.  5.  Metformin 1000 mg oral b.i.Harris.  6.  Xtandi 40 mg oral daily. 7.  Alprazolam 0.2 mg 1 tablet t.i.Harris.  8.  Ferrous sulfate 325 mg oral daily. 9.  Docusate sodium 100 mg oral daily. 10.  Pentoxifylline 400 mg oral b.i.Harris.  11.  Fish oral 1000 mg oral daily. 12.  Omeprazole 20 mg oral daily. 13.  Multivitamin 1 tablet oral daily. 14.  Folic acid 0.8 mg oral daily.   ALLERGIES: No known drug allergies.   FAMILY HISTORY: Unremarkable.   SOCIAL HISTORY: Denies any alcohol or tobacco use. Lives by himself.   REVIEW OF SYSTEMS:    CONSTITUTIONAL: Denies any fever, chills, fatigue or weakness. No weight gain or loss.  EYES: No vision changes. ENT:  No hearing loss, tinnitus or pain.  RESPIRATORY: Denies cough, shortness of breath or wheezing.  CARDIOVASCULAR: Denies chest pain or arrhythmias. GASTROINTESTINAL: Some recent coffee-ground emesis.  Otherwise no nausea or abdominal pain.  GENITOURINARY:  No dysuria or incontinence.  INTEGUMENT: Drainage and black toes on the left foot. Some drainage from the toes on the right. Some odor to this.  MUSCULOSKELETAL: Denies any significant arthritic symptoms. Gangrenous changes to the left forefoot.  NEUROLOGIC: Denies any specific numbness or paresthesias.   PHYSICAL EXAMINATION: VASCULAR: Pedal pulses are palpable. Capillary filling time intact to the toes on the right foot, absent on the left due to the gangrene.  NEUROLOGICAL: Some loss of sensation distally in the foot.  INTEGUMENT: Skin is warm, dry and supple. There is some bilateral edema. Gangrenous changes to all 5 digits on the left, ending at the metatarsophalangeal joints. Significant purulence from around the great toe joint on the left. Also significant purulence from the right second toe from an ulceration medially. Significant erythema and edema in the second toe. Also full thickness ulceration on the right fifth toe.  MUSCULOSKELETAL: Adequate range of motion. Muscle testing is deferred.   IMPRESSION: 1.  Gangrene, entire left forefoot.   2.  Full-thickness ulceration, presumed osteomyelitis, right second toe. Full thickness ulceration, fifth toe, with possible osteomyelitis.   PLAN: I discussed with the patient the need for transmetatarsal amputation on the left foot to remove all of the gangrenous toes. Also discussed the need for amputation of the right second and fifth toes. The patient is very hesitant and worried  about this. He states that he mentally is not ready to have this done. I discussed with the patient that tomorrow would be the best time to go ahead and have this done, but again he reiterates several times that he is not mentally prepared for this. Discussed with him consequences of not performing the amputation, which could include sepsis and need for a higher-up amputation. At this point, he is unwilling to sign  the consent form, and we will check back tomorrow to see about getting this scheduled.   ____________________________ Linus Galas, DPM tc:mr Harris: 07/10/2013 17:16:09 ET T: 07/10/2013 19:36:37 ET JOB#: [id number]  cc: Linus Galas, DPM, <Dictator> Jalena Vanderlinden DPM ELECTRONICALLY SIGNED 08/06/2013 11:35

## 2014-08-09 NOTE — Consult Note (Signed)
PATIENT NAME:  [name], WESTALL MR#:  [id number] DATE OF BIRTH:  Apr 14, 1941  DATE OF CONSULTATION:  02/18/2014  REFERRING PHYSICIAN:   CONSULTING PHYSICIAN:  Audery Amel, MD  IDENTIFYING INFORMATION AND REASON FOR CONSULT: A 74 year old man with advanced prostate cancer. Consult for depression.   HISTORY OF PRESENT ILLNESS: Information obtained from the chart and from my previous knowledge of the patient. I have seen the patient in consultation in several settings, now on his several times in the hospital. Today, the patient was refusing to speak to me at all. He comes into the hospital once again refusing to take care of himself, suffered a fall at home, very withdrawn, non-cooperative. Unknown if he has been taking any of his medication at home. He had recently had a stay at a geriatric psychiatry hospital in [place]. Some adjustment may have been made to medication. Since then, he has been back to our Emergency Room again. As usual, the patient is not really cooperative with psychiatric evaluation. He also is not capable of making further reasonable decisions about taking care of himself at home. He has been on 2 different antidepressants plus aripiprazole so far without any clear improvement in his mood. The patient will not give me any further history.   PAST PSYCHIATRIC HISTORY: This gentleman with a stage IV cancer. Has what sounds like a standing history of depression intermittently for years. No history of suicide attempts or hospitalizations. Has been on antidepressants, not clear if anything has worked.   SOCIAL HISTORY: The patient has several adult children but he has been living alone. He does not have a guardian at this point and has been insisting on going back home to live. At this point he seems to be clearly incapable of taking care of himself. One of his sons did die recently in a house fire, which is presumed to be a severe stress for him.   MEDICAL HISTORY: The patient has  prostate cancer, which is stage IV with extensive bone mets. Coronary artery disease, hypertension, COPD, diabetes, recent amputation.   SUBSTANCE ABUSE HISTORY: No known history of substance abuse.   FAMILY HISTORY: Denies any family history of mental illness.   CURRENT MEDICATIONS: Citalopram 40 mg a day, mirtazapine 45 mg at night, Abilify 5 mg per day, carvedilol 6.25 mg twice a day, hydromorphone p.r.n., insulin p.r.n., Synthroid 50 mcg a day, senna b.i.d., ceftriaxone 1 gram q. 24 hours, Seroquel 25 mg at night.   ALLERGIES: No known drug allergies.   REVIEW OF SYSTEMS: The patient refuses to cooperate in any of this although he does go so far as to tell me he is not in any physical pain.   MENTAL STATUS EXAMINATION: The patient was clearly awake when I came into the room. He responded to 1 or 2 questions with single words. Made brief eye contact. After that he closed his eyes and refused to answer any further questions. Did not move throughout the entire interview. Psychomotor activity very almost nonexistent. Speech almost nonexistent. Affect was completely flat. Poor self care. Unable to assess cognitive abilities.   LABORATORY RESULTS: Multiple lab abnormalities including a CK over 1000, normal lipase, AST elevated 1679, alkaline phosphatase over 4000, glucose is running in the 300s, creatinine elevated at 1.5, sodium low at 129, potassium low at 5.3. CBC: Multiple abnormalities, hematocrit 28.6, hemoglobin low at 9.3. Urinalysis: Lots of blood in the urine.   ASSESSMENT: This is a 74 year old man who gives every impression of being  severely depressed and withdrawn. Complicated by his medical problems and his failure to cooperate with treatment. He has been on antidepressants recently, but it is unclear if he has been compliant with them. At this point not clear if he will be able to take pills or be further compliant with them. His current mental status suggests a lack of capacity to make  any decisions for himself right now.   TREATMENT PLAN: I need to have a discussion with other providers as well as his family I think. Clearly he is severely depressed. He would not be able to be cared for on the psychiatry ward nor would he benefit from that. Doubt that he would be able to benefit from even a geriatric psychiatry ward. With his current illness he is probably not a very good candidate for ECT. We need to discuss his prognosis. I tried to have some conversation with him about even what might make him comfortable or what his concerns were, but he was not able to even begin to discuss that with me. Did not change any orders at this point. I will continue to help follow up.   DIAGNOSIS, PRINCIPAL AND PRIMARY:  AXIS I: Major depression, severe.   SECONDARY DIAGNOSES:  AXIS I: No further.  AXIS II: No diagnosis.  AXIS III: Metastatic prostate cancer, possible rhabdomyolysis, multiple medical problems.     ____________________________ Audery AmelJohn T. Clapacs, MD jtc:AT D: 02/18/2014 22:38:12 ET T: 02/19/2014 00:19:34 ET JOB#: [id number]  cc: Audery AmelJohn T. Clapacs, MD, <Dictator> Audery AmelJOHN T CLAPACS MD ELECTRONICALLY SIGNED 02/22/2014 14:59

## 2014-08-09 NOTE — Consult Note (Signed)
Brief Consult Note: Diagnosis: Major depressive disorder recurent severe, anxiety disorder NOS.   Patient was seen by consultant.   Consult note dictated.   Recommend further assessment or treatment.   Orders entered.   Discussed with Attending MD.   Comments: [name] has had a h/o depression and anxiety at least since 2010 when his wife left him. There are multiple stressors: worsening of physical health, finacial, housing as his house is to be condemned, and primary support all leading to worsening of depression. He is irritable and short but no longer suicidal. He is preoccupied with his transfer to SNF and busy on the phone about details of his placement. He is forward thinking and more optimistic about the future. He agrees to medication adjustments.  PLAN: 1. The patient no longer needs sitter. I will discontinue.   2.  Will increase zoloft to 100 mg and start seroquel 50 mg for mood stabilization and sleep.   3. Please continue low dose Xanax.   4. I will follow up.  Electronic Signatures: Kristine LineaPucilowska, Jolanta (MD)  (Signed 01-Apr-15 15:54)  Authored: Brief Consult Note   Last Updated: 01-Apr-15 15:54 by Kristine LineaPucilowska, Jolanta (MD)

## 2014-08-09 NOTE — H&P (Signed)
PATIENT NAME:  [name], [name] MR#:  [id number] DATE OF BIRTH:  Jun 04, 1940  DATE OF ADMISSION:  02/17/2014  PRIMARY CARE PHYSICIAN:     PRIMARY ONCOLOGIST:  Rickey SamJanak K. Choksi, MD  REFERRING PHYSICIAN:  Rebecka ApleyAllison P. Webster, MD   HISTORY OF PRESENT ILLNESS:  [name] is a 74 year old male with history of stage IV metastatic cancer, progressive, and was treated hormonal therapy without much success and also was noted to be noncompliant with the followup and possibly treatment. In August 2015, he decided to stop the treatment. At that time, the patient refused hospice, to get a bone scan, or bisphosphonate therapy. The patient was also evaluated by psychiatry to determine that the patient was depressed but without any suicidal ideation. The patient was started on multiple psychiatric medications. The patient had multiple visits to the Emergency Department with depression with suicidal ideation; however, it was felt that the patient was depressed but not suicidal and was discharged back home. Last night, the patient states that she fell out of the bed. Could not tell the circumstances or if he lost consciousness. The patient states he has been taking too much pain medication in order to control the pain, however, continues to have severe pain. The patient states he did not have any bowel movements in the last 3 days. He has been eating poorly. He has severe generalized weakness, unable to walk, losing balance. Workup in the Emergency Department:  CT of the abdomen and pelvis showed diffuse metastatic lesions throughout the skeleton with multiple rib fractures, could not determine the recent ones. The patient has not been taking his medications for diabetes mellitus. The patient is found to have high AST and high alkaline phosphatase levels. CPK levels are still pending.   PAST MEDICAL HISTORY: 1.  Prostate cancer stage IV, not on any treatment. The patient continues to have progressive disease.  2.  Diffuse  metastatic bone lesions with multiple fractures.  3.  Coronary artery disease.  4.  Hypertension.  5.  Congestive heart failure.  6.  Peripheral vascular disease.  7.  Diabetes mellitus type 2.  8.  Kidneys stones.  9.  Hypothyroidism.  10.  Depression.   PAST SURGICAL HISTORY:   1.  Transmetatarsal amputation of the left foot.  2.  Hernia repair.  3.  Lithotripsy.  4.  Cardiac stent.  5.  Tonsillectomy.   ALLERGIES:  No known drug allergies.   HOME MEDICATIONS: 1.  Ambien 5 mg at bedtime.  2.  Tamsulosin 0.4 mg once a day.  3.  Simvastatin 20 mg once a day.  4.  Quetiapine 50 mg at bedtime.  5.  Percocet 5/325 mg 1 tablet every 6 hours.  6.   400 mg 2 times a day.  7.  Oxycodone 5 mg every 6 hours.  8.  Mirtazapine 45 mg at bedtime.  9.  Levothyroxine 50 mcg once a day.  10.  Glipizide extended release 1 tablet once a day.  11.  Dronabinol 2.5 mg 2 times a day.  12.  Depakote 125 mg 2 tablets at bedtime.  13.  Citalopram 40 mg once a day.  14.  Coreg 12.5 mg 2 times a day.  15.  Aspirin 81 mg daily.  16.  Aripiprazole 5 mg once a day.  17.  Norco 5/325 mg every 6 hours as needed.   SOCIAL HISTORY:  No history of smoking, drinking alcohol, or using illicit drugs. Lives by himself and gets Meals on Wheels  and cooks for himself.  FAMILY HISTORY:  Significant for diabetes mellitus and heart disease.   REVIEW OF SYSTEMS:  CONSTITUTIONAL:  Severe generalized weakness.  EYES:  No change in vision.  EARS, NOSE, AND THROAT:  No change in hearing.  RESPIRATORY:  Has shortness of breath.  CARDIOVASCULAR:  Has no chest pain or palpations.  GASTROINTESTINAL:  Has poor appetite and constipation.  GENITOURINARY:  Has frequent urination and hematuria.  MUSCULOSKELETAL:  Diffuse pain despite being on multiple narcotic medications.  SKIN:  No rash or lesions.  NEUROLOGICAL:  No weakness or numbness in any part of the body.   PHYSICAL EXAMINATION: GENERAL:  This is a very  cachectic-looking male lying down in the bed. He looks extremely lethargic.  VITAL SIGNS:  Temperature 99, pulse 92, blood pressure 176/97, respiratory rate 18, oxygen saturation is 94% on room air.  HEENT:  Head is normocephalic and atraumatic. There is no scleral icterus. Conjunctivae are normal. Pupils are equal and react to light. Mucous membranes are dry. I cannot examine the oropharynx.  NECK:  Supple. No lymphadenopathy. No JVD. No carotid bruit. No thyromegaly.  CHEST:  Has focal tenderness in multiple areas. Good air entry bilaterally.  HEART:  S1, S2 regular. No murmurs are heard. Tachycardia.  ABDOMEN:  Bowel sounds present. Soft. Has tenderness, however, no rebound or guarding.  EXTREMITIES:  No pedal edema. Pulses are 2+.  SKIN:  No rash or lesions.  MUSCULOSKELETAL:  Good range of motion in all of the extremities.  NEUROLOGIC:  The patient is quite somnolent, however, answers some of the questions appropriately. Irritable with a depressed affect. Motor is 5/5 in upper and lower extremities.   LABORATORY DATA:  Urinalysis:  WBC of 3900, negative for nitrites and leukocyte esterase. BUN is 29, creatinine 1.53, sodium 129, potassium 5.3, AST 1700, alkaline phosphatase of more than 4000. CBC:  Hemoglobin of 9.0, the rest of all the values are within normal limits.   ASSESSMENT AND PLAN:   1.  Severe debility with frequent falls. The patient is on multiple sedative medications as well as severely debilitated and also dehydrated. Admit the patient to a medical bed. Continue with IV fluids.  2.  Metastatic prostate lung cancer with diffuse metastasis to the bone. The patient wants himself to be consulted by hospice. The patient states he has not made his decision yet; however, the patient wants his pain to be controlled.  3.  Constipation. We will keep the patient on MiraLax and senna b.i.Harris.  4.  Debility. Involve physical therapy and occupational therapy.  5.  Depression. Continue with the  current medications. The patient was evaluated by psychiatry multiple times.  6.  Hematuria from metastatic prostate cancer. No further workup is needed.   TIME SPENT:  60 minutes.   ____________________________ Susa Griffins, MD pv:nb Harris: 02/17/2014 04:19:00 ET T: 02/17/2014 05:59:42 ET JOB#: [id number]  cc: Unknown cc Susa Griffins, MD, <Dictator>    Clerance Lav Shauntia Levengood MD ELECTRONICALLY SIGNED 02/26/2014 21:12

## 2014-08-09 NOTE — H&P (Signed)
PATIENT NAME:  [name], [name] MR#:  [id number] DATE OF BIRTH:  04/18/1941  DATE OF ADMISSION:  03/02/2014  REFERRING PHYSICIAN:  Darien Ramus, MD   PRIMARY CARE PHYSICIAN:  Marlyn Corporal, MD   ADMITTING DIAGNOSIS:  Intractable pain and agitation.   HISTORY OF PRESENT ILLNESS:  This is a 74 year old Caucasian male with a history of metastatic prostate cancer, who is well known to the hospital staff, who presents today complaining of whole-body pain. The patient also complains of urinary urgency and some dysuria. He is very agitated and concerned about multiple things outside of his health including what is happening in our Emergency Department around him. This is the verbal report I have received regarding the circumstances at the nursing home where he lives that contributed to his arrival in the Emergency Department. He is clearly confused and complains of difficulty urinating. He also states that he is still in pain after receiving 2 doses of IV Dilaudid in the Emergency Department, ketamine, and Ativan. The patient also received 2.5 mg of IV Haldol. None of this seemed to calm him down. His intractable pain is very concerning and prompted the Emergency Department to call for admission.   REVIEW OF SYMTOPMS:  The patient denies chest pain, shortness of breath, nausea, vomiting or diarrhea.  He admits to urgency and difficulty of urination.  He volunteers this information.  Otherwise, it is hard to focus the patient enough to get clear answers in a coherant way.  PAST MEDICAL HISTORY:  Metastatic prostate cancer to the bone, coronary artery disease, hypertension, peripheral vascular disease, diabetes type 2, congestive heart failure, hypothyroidism, and depression.   PAST SURGICAL HISTORY:  Transmetatarsal amputation of the left foot, hernia repair, lithotripsy, tonsillectomy as well as cardiac stent placement.   SOCIAL HISTORY:  The patient currently lives in a nursing facility. He does not  have a history of tobacco, alcohol, or drug use.   FAMILY HISTORY:  Significant for heart disease and diabetes.   MEDICATIONS: 1.  Acetaminophen with hydrocodone 325 mg/5 mg tablet 1 tablet p.o. every 6 hours as needed for moderate pain.  2.  Aripiprazole 5 mg 1 tab p.o. daily.  3.  Aspirin 81 mg 1 tab p.o. daily.  4.  Carvedilol 6.25 mg 1 tab p.o. b.i.d.  5.  Citalopram 40 mg 1 tab p.o. daily.  6.  Docusate sodium 100 mg 1 capsule p.o. b.i.d. as needed.  7.  Dronabinol 2.5 mg 1 capsule p.o. b.i.d.  8.  Levothyroxine 50 mcg 1 tablet p.o. every morning.  9.  Magnesium oxide 400 mg 1 tablet p.o. daily.  10.  Megace 40 mg/mL, 10 mL p.o. b.i.d.  11.  Mirtazapine 45 mg 1 tab p.o. at bedtime.  12.  Oxycodone 5 mg 1 tablet p.o. every 6 hours as needed for pain.  13.  Pentoxifylline 400 mg extended release 1 tablet p.o. b.i.d.  14.  Polyethylene glycol 3350 powder reconstitution 17 grams mixed with 8 ounces of water once daily by mouth.  15.  Quetiapine 25 mg 1 tablet p.o. at bedtime.  16.  Simvastatin 20 mg 1 tab p.o. at bedtime.  17.  Tamsulosin 0.4 mg 1 capsule p.o. daily.  18.  Zolpidem 5 mg 1 tablet p.o. at bedtime as needed for sleep.   ALLERGIES:  No known drug allergies.   PERTINENT LABORATORY RESULTS AND RADIOGRAPHIC FINDINGS:  Serum glucose is 205, BUN 18, creatinine 1.37, serum sodium 138, potassium 4.2, chloride 105, bicarbonate 25,  calcium 7.9, serum albumin 2.1, total bilirubin 0.2, alkaline phosphatase 2091, AST 19, and ALT is 11. White blood cell count is 7, hemoglobin 8.6, hematocrit 27.7, and platelet count is 234,000.   PHYSICAL EXAMINATION: VITAL SIGNS:  Temperature is 98, pulse 84, respirations 20, blood pressure 193/85, and pulse oximetry is 97% on room air.  GENERAL:  The patient is alert but oriented only to person and place. He is very agitated but does not appear to be in physical distress except when observed urinating, which requires considerable strain.  HEENT:   Normocephalic, atraumatic. Pupils are equal, round, and reactive to light and accommodation. Extraocular movements are intact. Mucous membranes are moist.  NECK:  Trachea is midline. No adenopathy.  CHEST:  Symmetric and atraumatic.  CARDIOVASCULAR:  Regular rate and rhythm. Normal S1 and S2. No rubs, clicks, or murmurs appreciated.  LUNGS:  Clear to auscultation bilaterally. Normal effort and excursion.  ABDOMEN:  Positive bowel sounds. Soft, nontender, nondistended. No hepatosplenomegaly.  GENITOURINARY:  The patient is uncircumcised. Normal external male genitalia.  MUSCULOSKELETAL:  The patient moves all 4 extremities equally. There is 5/5 strength in upper and lower extremities bilaterally.  SKIN:  No rashes or lesions.  EXTREMITIES:  No clubbing, cyanosis, or edema.  NEUROLOGIC:  Cranial nerves II through XII are grossly intact.  PSYCHIATRIC:  Mood is depressed. Affect is anxious.   ASSESSMENT AND PLAN:  This is a 74 year old male admitted for intractable pain and agitation.   1.  Pain. The patient has received multiple analgesics in the Emergency Department and has not yet settled down. He continues to complain of pain, but some of this may be magnified by dementia and depression. He has documented bony metastases, and I believe him when he states that his urinary hesitancy is very distressing. I have started a long-acting oral narcotic with short-acting IV narcotics for breakthrough pain while the patient is in the hospital. Once we have an adequate count of the IV narcotic dosing, we will switch the patient to an oral short-acting narcotic. The required doses to relieve his pain may require [place] to increase the dose of long-acting narcotic as well. I have ordered a pain consult in addition to the plan outlined above.  2.  Prostate cancer. Much of the patient's pain is likely secondary to bone pain, although he does not complain of long bones actually hurting at this time. His ionized calcium  is normal, but he may benefit from calcium metabolism therapy with medication such as Zometa or Xofigo. I have ordered intact parathyroid hormone as well as a vitamin D level. Hematology and oncology consult has been ordered to address these particular recommendations.  3.  Acute kidney injury. The patient has a minimally increased creatinine. His GFR has barely decreased. He does eat by mouth, and I have encouraged him to drink plenty of fluid. He is so agitated at this time that I do not believe attaching him to an IV would be beneficial, as it may be more harmful than good.  4.  Hypothyroidism. We will check the patient's TSH and continue his levothyroxine.  5.  Agitation and confusion. Continue Seroquel. I have placed a psychiatric consult for recommendations regarding placement and potential management of his agitation. I have ordered a sitter to his bedside for safety as well.  6.  Diabetes type 2. Sliding scale insulin. I have held the patient's oral hypoglycemics.  7.  Hypertension. Continue Coreg.  8.  Coronary artery disease. Continue statin for  risk reduction.  9.  Congestive heart failure, stable at this time.  10.  Peripheral vascular disease. Continue pentoxifylline.  11.  Depression. Continue citalopram and aripiprazole.  12.  Deep vein thrombosis prophylaxis with sequential compression devices.  13.  Gastrointestinal prophylaxis is unnecessary, as the patient is not critically ill.  14.  Disposition. The patient may benefit from a memory care facility or a skilled nursing home that is better prepared to control his agitation. The patient has had palliative care consult on previous admissions to this hospital, but the primary care team may wish to obtain another to address these concerns. I have placed a discharge planning consult for case management.   CODE STATUS:  The patient is still a full code.   TIME SPENT ON ADMISSION ORDERS AND PATIENT CARE:  Approximately 45  minutes.   ____________________________ Kelton PillarMichael S. Sheryle Hailiamond, MD msd:nb D: 03/03/2014 04:42:32 ET T: 03/03/2014 05:47:59 ET JOB#: [id number]  cc: Kelton PillarMichael S. Sheryle Hailiamond, MD, <Dictator> Kelton PillarMICHAEL S DIAMOND MD ELECTRONICALLY SIGNED 03/06/2014 4:46

## 2014-08-09 NOTE — Consult Note (Signed)
   Comments   Asked by Dr Garnetta BuddyFaheem to see [name]. 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 Pt known to me from hospitalization 12/2013. Per old chart, pt was involuntarily comitted by Dr Toni Amendlapacs and [facility]. It is not clear what happened in the interim but pt [facility] (1 wk post d/c). Today, pt is angry and does not participate in conversation.  qualifies for home hospice based on his stage IV prostate cancer. However, I am not sure where he will be when he is d/c'd to receive this service. Pt does not appear to be approaching end of life and is not appropriate for the Hospice Home.  [name] is pt's HCPOA [phone]).   Electronic Signatures: Larin Depaoli, Harriett SineNancy (MD)  (Signed 12-Oct-15 11:28)  Authored: Palliative Care   Last Updated: 12-Oct-15 11:28 by Thessaly Mccullers, Harriett SineNancy (MD)

## 2014-08-09 NOTE — Consult Note (Signed)
Psychiatry: PAtient seen and chart reviewed. Spoke with daughter today. Patient remains passively resistant to most treatment or conversation. Today he reported his mood was "about the same" . Resists medication selectively but without rationale. Attempts to engage him in conversations about treatment options usually result in him yelling about wanting to be left alone.  is awake and alert. Speach decreased in amout. Affect flat to irritable. No evident delusions but remains fixated on imagined costs. tried to discuss treatment of his depression to no avail as he will not listen. I have prescribed medication which he selectively refuses. I have proposed a trial of ketamine infusion in an attempt to improve mood and have discussed this with daughter and tried to discuss it with patient. Daughter prefers to discuss it further with family. Patient refuses to discuss it. I have written a letter to the daughter expressing my opinion that patient lacks capacity to make rational decisions for her use in obtaining guardianship. Will continue to follow.  Electronic Signatures: Audery Amellapacs, John T (MD)  (Signed on 05-Nov-15 22:25)  Authored  Last Updated: 05-Nov-15 22:25 by Audery Amellapacs, John T (MD)

## 2014-08-09 NOTE — H&P (Signed)
PATIENT NAME:  [name], [name] MR#:  [id number] DATE OF BIRTH:  1941-02-07  DATE OF ADMISSION:  07/09/2013  REFERRING PHYSICIAN: Loraine Leriche R. Fanny Bien, MD  PRIMARY CARE PHYSICIAN: Marlyn Corporal, MD  PRIMARY ONCOLOGY: Gerome Sam. Choksi, MD  CHIEF COMPLAINT: Coffee ground emesis.  HISTORY OF PRESENT ILLNESS: This is a 74 year old male with known history of stage-IV prostate cancer with bone metastasis, currently on XTANDI for chemotherapy. The patient presents with complaints of coffee ground emesis. The patient appears to be very anxious, very noncompliant, as well. Does not follow up with his physicians as an outpatient. Daughter at bedside. She assists with the history. Patient reports he has been having coffee ground emesis for the last 4 days. As per daughter, patient initially did not want to come. He was talking to her over the phone. He reports she had multiple attempts to visit him at home, wanted to go see him to assist him with home with his home needs, but he has been refusing that. Reports she went to with cop to his home where they found him lying in his vomit at home, where they called EMS, when he finally agreed to come. Patient's hemoglobin was found to be dropped from 14.9 a few months ago  to 9.2. The patient had dry crusted blood at his mouth. Reports has been having dark color stools for a brief period of time, as he cannot specify how long as he is a poor historian in general. As well patient, he is on aspirin, Plavix, and pentoxifylline. The patient is known to have history of peripheral vascular disease. Has been seen in the past by Dr. Kateri Mc. 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 Clide Cliff, where he is supposed to have an amputation  in the near future, as well. The patient was slightly tachycardic upon presentation, but did improve with IV fluids. The patient was ordered 2 units of packed red blood cells. As well, patient was found to have hyperglycemia with a  glucose in the 400 range. As well, he is known to have history of chronic kidney disease with baseline creatinine at 1.6. The patient's labs were significant for elevated alkaline phosphatase. He denies any right upper quadrant pain. Bilirubin was within normal limits. The patient's alkaline phosphatase has been progressively increasing and this is most likely due to his mental status. The patient did deny any suicidal ideation for me, but he did express suicidal thought to his nurse. As well, he did express this to his daughter. So,  he was kept on suicidal precaution.   ALLERGIES: No known drug allergies.   HOME MEDICATIONS:  1.  Aspirin 81 mg oral daily.  2.  Plavix 75 mg oral daily.  3.  Lortab 5/325 every 4 to 6 hours as needed.  4.  Glipizide 5 mg oral 2 times a day.  5.  Metformin 1000 mg oral 2 times daily.  6.  XTANDI 40 mg oral daily.  7.  Alprazolam 0.2 mg 1 tablet oral 3 times a day.  8.  Ferrous sulfate 325 mg oral daily.  9.  Docusate sodium 100 mg oral daily.  10.  Pentoxifylline 400 mg oral 2 times a day.  11.  Fish oil over 1000 mg oral daily. 12.  Omeprazole 20 mg oral daily.  13.  Multivitamin 1 tablet oral daily.  14.  Folic acid 0.8 mg oral daily.   PAST MEDICAL HISTORY:  1.  History of stage-IV prostate  cancer; follows with Dr. Doylene Canning. 2.  Diabetes mellitus.  3.  Congestive heart failure.  4.  Tonsillectomy.  5.  Depression.  6.  Hypothyroidism.  7.  Hypertension.  8.  Coronary artery disease.  9.  Hernia repair.  10.  Cardiac stents.   FAMILY HISTORY: Denies any family history of coronary artery disease at young age.    SOCIAL HISTORY: Denies any smoking, any alcohol, any illicit drug use. Lives at home by himself.   REVIEW OF SYSTEMS:  CONSTITUTIONAL: Patient denies fever, chills. Complains of fatigue, weakness. Denies weight gain, weight loss.  EYES: Denies blurry vision, double vision, inflammation, glaucoma.  ENT: Denies tinnitus, ear pain, hearing  loss, epistaxis, or discharge.  RESPIRATORY: Denies cough, wheezing, hemoptysis, dyspnea, COPD.  CARDIOVASCULAR: Denies any chest pain, edema, arrhythmia, palpitation, syncope.  GASTROINTESTINAL: Denies nausea, diarrhea, abdominal pain, melena, rectal bleed, bright red blood per rectum, constipation, diarrhea. Reports coffee ground emesis. Reports dark-colored stools, but cannot specify for how long.  GENITOURINARY: Denies dysuria, hematuria, renal colic.  ENDOCRINE: Denies polyuria, polydipsia, heat or cold intolerance.  HEMATOLOGY: Denies easy bruising, bleeding, diathesis.  INTEGUMENT: Denies acne, rash, or skin lesion.  MUSCULOSKELETAL: Denies any gout, arthritis, or cramps. Reports toe gangrene progressing over the last year.  NEUROLOGIC: Denies any tremors, vertigo, ataxia, headache, migraine, CVA, TIA. PSYCHIATRIC: Denies any anxiety or depression or suicidal ideation or thoughts, even though this was confirmed by his daughter and the nurse. He expressed some suicidal ideas ideation.   PHYSICAL EXAMINATION:  VITAL SIGNS: Temperature 99.5, pulse 97, respiratory rate 18, blood pressure 135/66, saturating 95% on room air.  GENERAL: Well-nourished male who looks comfortable in bed, in no apparent distress.  HEENT: Head: Atraumatic, normocephalic. Pupils equal, reactive to light. Pink conjunctivae. Anicteric sclerae. Dry oral mucosa. Has dry crusted blood around the lips and his mouth.  NECK: Supple. No thyromegaly. No JVD. No carotid bruits.  CHEST: Good air entry bilaterally. No wheezing, rales, rhonchi.  CARDIOVASCULAR: S1, S2 heard. No rubs, murmur, or gallops.  ABDOMEN: Soft, nontender, nondistended. Bowel sounds present.  EXTREMITIES: No edema. No clubbing. Had pedal pulses and radial pulses felt bilaterally. Has evidence of dry gangrene in his lower extremities with small gangrene at the tip of the toes in his right foot digit and in the left foot. He has gangrene in his  toes. PSYCHIATRIC: The patient appears to be having flat affect and a depressed mood. Awake, alert x 3.  NEUROLOGIC: Cranial nerves grossly intact. Motor 5 out of 5. No focal deficits.  SKIN: Normal skin turgor. Warm and dry.  MUSCULOSKELETAL: Has toe gangrene mainly in the left lower extremity.   PERTINENT LABS: Glucose 442, BUN 20, creatinine 1.68, sodium 130, potassium 4.3, chloride 100. ALT 11, AST 16, alkaline phosphatase 831, albumin 2.5. Troponin 0.04. White blood cells 12.3, hemoglobin 9.2, hematocrit 28.2, platelets 369,000. INR 1.2.   ASSESSMENT AND PLAN: 1.  Hematemesis. This is most likely related to upper gastrointestinal bleed. The patient is on multiple anticoagulation. Will hold his aspirin. Will hold his Plavix. Will hold his pentoxifylline. He will be started on a Protonix drip. Given the fact he is having an active gastrointestinal bleed, with already 1 episode in the ED, he will be transfused 2 units of packed blood cells. Will monitor his hemoglobin every 8 hours. Will continue on Protonix drip. Will consult gastroenterology. He will be kept nothing by mouth.  2.  Hyperglycemia. Will give the patient a fluid bolus. Will have him  on insulin sliding scale. Will keep fingersticks q.1 hour for the next 4 hours.  3.  Chronic kidney disease: Appears to be at baseline. Will continue with hydration.  4.  Elevated alkaline phosphatase. This is most likely due to his bone metastases from his prostate cancer.  5.  Stage-IV prostate cancer. For now will hold patient's medication, XTANDI. Will consult Dr. Doylene Canninghoksi to come see and evaluate the patient.  6.  Suicidal ideation. Patient to be kept on suicidal precaution. Will consult psychiatric service.  7.  Deep vein thrombosis prophylaxis. Patient will avoid any chemical anticoagulation due to his upper gastrointestinal bleed. Will have him on sequential compression device and TED hose.   CODE STATUS: This was discussed with the patient in the  presence of his daughter. Patient wishes to be full code for now. As well, patient likely will need placement as he appears cannot take care of himself at home.   TOTAL TIME SPENT ON ADMISSION AND PATIENT CARE: Sixty minutes.   ____________________________ Starleen Armsawood S. Lancelot Alyea, MD dse:am D: 07/09/2013 01:20:00 ET T: 07/09/2013 03:40:25 ET JOB#: [id number]  cc: Starleen Armsawood S. Shaida Route, MD, <Dictator> Teja Judice Teena IraniS Khameron Gruenwald MD ELECTRONICALLY SIGNED 07/11/2013 0:44

## 2014-08-09 NOTE — Consult Note (Signed)
   Comments   I spoke with pt's daughter, Doneta PublicLisa Ainsworth, by phone. She confirms that she is pt's POA but says that pt does not have a HCPOA. She says that family do not think pt should be at home alone and was in ALF for a short time but refused to stay there. She doubts pt is taking his meds correctly. Daughter says that code status has been discussed with pt and he wants to be a full code. Daughter was in agreement with my discussing all of the above with pt.  spoke with pt again. He is adamant that he will return home at discharge. He says that he does not want to continue tx for his cancer but refused to discuss hospice services. I asked him about naming a HCPOA but he refused to do this. He repeately says that he just needs a few days to rest and then he will be ok.   Electronic Signatures: Kodi Guerrera, Harriett SineNancy (MD)  (Signed 22-Sep-15 11:38)  Authored: Palliative Care   Last Updated: 22-Sep-15 11:38 by Lewis Grivas, Harriett SineNancy (MD)

## 2014-08-09 NOTE — Consult Note (Signed)
Brief Consult Note: Diagnosis: depression nos.   Patient was seen by consultant.   Consult note dictated.   Recommend further assessment or treatment.   Orders entered.   Discussed with Attending MD.   Comments: Psychiatry: Patient seen, chart reviewed, note dictated. PAtient spoke to me VERY briefly then told me he did not want to talk anymore and asked me to leave. Despite minimal interaction, there appear to be reasons to suppose depression and to believe he has been non-complient. I am going to change the celexa to remeron to help with appetite while treating depression. Will attempt reinterview later and will follow.  Electronic Signatures: Clapacs, Jackquline DenmarkJohn T (MD)  (Signed 23-Sep-15 13:08)  Authored: Brief Consult Note   Last Updated: 23-Sep-15 13:08 by Audery Amellapacs, John T (MD)

## 2014-08-09 NOTE — Consult Note (Signed)
PATIENT NAME:  [name], [name] MR#:  [id number] DATE OF BIRTH:  16-Jul-1940  DATE OF CONSULTATION:  07/09/2013  REFERRING PHYSICIAN:  Albertine Patricia, MD CONSULTING PHYSICIAN:  Corky Sox. Zettie Pho, PA-C  ATTENDING GASTROENTEROLOGIST:  Rickey Dames, MD  REASON FOR CONSULTATION: Coffee-ground emesis.   HISTORY OF PRESENT ILLNESS: This is a 74 year old gentleman who presented with a 4 to 5-day history of coffee-ground emesis. He has also been experiencing extremely dark-colored stools for the past several days. Unfortunately, this patient has a history of noncompliance and it took quite a bit of effort to convince him to come into the hospital, according to the medical records, and it is somewhat unclear and how long these symptoms have truly been going on. The patient is denying any abdominal pain though this is inconsistent with my physical exam findings. He is lying in bed this morning shortly after receiving Xanax and, therefore, he is somewhat altered in getting this history. He does have a sitter with him because he did express some concerns of suicidal ideation. He has been n.p.o. this morning and has not had any further nausea or vomiting since being taken to the floor. His hemoglobin was 9.2 at admission and his baseline hemoglobin did appear to be greater than 14. Therefore, he was transfused 2 units of packed red blood cells and an hour after he received the transfusion his hemoglobin was 10.7. MCV is 83. He does have a markedly elevated alkaline phosphatase level though he does have known prostate cancer with bone metastasis. He is denying any chest pain or shortness of breath. No further nausea or vomiting but he does still appear somewhat uncomfortable. No fever or chills. No lightheadedness or dizziness.   PAST MEDICAL HISTORY: Stage 4 prostate cancer with bone metastasis, congestive heart failure, diabetes mellitus, depression, anxiety, hypothyroidism, coronary artery disease, hypertension.    PAST SURGICAL HISTORY: Tonsillectomy, hernia repair, cardiac stent placement.   SOCIAL HISTORY: The patient denies any alcohol, tobacco or illicit drug use.   FAMILY HISTORY: The patient denies any known family history of GI malignancy, colon polyps or IBD.   ALLERGIES: No known drug allergies.   HOME MEDICATIONS: Aspirin, Plavix, Lortab, glipizide, metformin, Xtandi, alprazolam, ferrous sulfate, fish oil, omeprazole, docusate sodium, pentoxifylline, folic acid, multivitamin.   REVIEW OF SYSTEMS: A 10-system  review of systems was obtained on the patient. Pertinent positives are mentioned above and otherwise negative.   OBJECTIVE: VITAL SIGNS: Blood pressure 119/62, heart rate 82, respiration 20, temp 100.4, bedside pulse oximetry 95%.  GENERAL: This is a pleasant, 74 year old gentleman, alert and oriented x 3 though he does seem somewhat altered after receiving his Xanax and is quite tiresome.  HEAD: Atraumatic, normocephalic.  NECK: Supple. No lymphadenopathy noted.  HEENT: Sclerae anicteric. Mucous membranes moist.  LUNGS: Respirations are even and unlabored. Clear to auscultation bilateral anterior lung fields.  HEART: Regular rate and rhythm. S1, S2 noted.  ABDOMEN: Soft, nontender, nondistended. Normoactive bowel sounds noted in all 4  quadrants. No guarding or rebound. No masses, hernias or organomegaly appreciated.  RECTAL: Deferred.  PSYCHIATRIC: Appropriate mood and affect though  he is tiresome as mentioned above.  EXTREMITIES: Negative for lower extremity edema, 2+ pulses noted in bilateral upper extremities.   LABORATORY DATA: White blood cells 12.3, hemoglobin 10.7, hematocrit 28.2 and platelets 369. Bilirubin 1.2, ALT 14, AST 32, alk phos 681. MCV 83. Lipase 112. INR 1.2, PT 15.3. Sodium 135, potassium 4.2, BUN 19, creatinine 1.63, glucose 228.   ASSESSMENT:  1.  Hematemesis, suspicious of an upper gastrointestinal bleed as this was described like coffee grounds.  2.   History of stage 4 prostate cancer with known metastasis to the bone.  3.  Anemia, likely secondary to blood loss but may also have some component of chronic kidney disease and malignancy.  4.  History of coronary artery disease, status post stent placement.   PLAN: I have discussed this patient's case in detail with Dr. Arther Harris, who is involved in the development of the patient's plan of care. At this time, the overall clinical picture is suggestive of an upper GI bleed and we would recommend that we proceed with an upper EGD for further evaluation. This procedure can be performed today and we will keep the patient n.p.o. status. In the interim, we do agree with him being maintained on Protonix therapy and checking serial hemoglobins to confirm stability. We will be prepared to transfuse as necessary. We will continue to monitor this patient throughout hospitalization and make further recommendations pending the EGD and per clinical course.   The above was discussed and agreed upon under supervisoratory agreement between attending gastroenterologist, Dr. Rayann Heman, and Loren Racer, physician assistant.    ____________________________ Corky Sox. Lorrene Graef, PA-C kme:cs D: 07/09/2013 13:50:59 ET T: 07/09/2013 14:04:54 ET JOB#: [id number]  cc: Corky Sox. Beila Purdie, PA-C, <Dictator> [place] PA ELECTRONICALLY SIGNED 07/09/2013 16:10

## 2014-08-09 NOTE — Consult Note (Signed)
Note Type Consult   HPI: Referred by PCP is Dr. Rosario Jacks.   This 74 year old Male patient presents to the clinic for follow up  for prostate cancer with bone mets.  Chief Complaint:  Historian Patient   Presenting Problem patient is here for ongoing evaluation regarding metastatic prostate cancer. states is having back pain 6/10. Very depressed, crying.   Positive Symptoms back pain   Negative Symptoms fever, chills, anorexia, weakness, nausea, vomiting, diarrhea, constipation, cough, shortness of breath, palpitations, burning with urination, urinary frequency, headache, numbness/tingling, hip pain, rash   Subjective: Chief Complaint/Diagnosis:   1. 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  Patient has progressive increase in PSA.   Gillermina Phy  has been discontinued in August of 626 HPI:   74 year old gentleman with a history of prostate cancer metastases to the bone progressing disease.  Declining condition.  Patient had diffuse all further treatment for prostate cancer.  Had multiple admission in the hospital with poorly controlled pain.  Patient has very poor social situation.  Frequent fall.  Patient was admitted in the psych unit from where he bent home AMA.  At the past and refused hospice care.  Was admitted in the hospital with increasing back pain bony pains.  Poor appetite.  Losing weight.   Review of Systems:  General: weakness  fatigue  due   to  malignancy  Performance Status (ECOG): 1  HEENT: no complaints  Lungs: cough  SOB  Cardiac: no complaints  GI: nausea/vomiting  poor appetite  GU: no complaints  Musculoskeletal: no complaints  back pain  Extremities: no complaints  swelling  Skin: no complaints  Neuro: no complaints  Psych: anxiety  Pain ?: Yes  Pain- Qualitative:  Moderate  Pain- Plan: Narcotic analgesic ordered  Discussed with patient: Prohylactic stimulant laxative  Stool softner  Dietary fiber  Review of Systems: All other systems were reviewed and found to be negative   Allergies:  No Known Allergies:   Significant History/PMH:   Prostate Cancer:    chf:    Prostate Cancer:    Tonsillectomy:    Depression:    Hypothyroidism:    HTN:    DM Type 2:    CAD:    Hernia Repair:    Cardiac Stent:   Preventive Screening:  Has patient had any of the following test? Colonscopy  Prostate Exam   Last Colonoscopy: Never   Last Prostate Exam: 2011   Smoking History: Smoking History Never Smoked.  PFSH: Family History: noncontributory  Social History: noncontributory  Comments: Patien  lives by himself  Additional Past Medical and Surgical History: Has been reviewed from multiple previous notes   Home Medications: Medication Instructions Last Modified Date/Time  Zofran ODT 4 mg oral tablet, disintegrating 1 tab(s) orally every 6 hours, As Needed- for Nausea, Vomiting  16-Nov-15 10:48  morphine 20 mg/mL oral concentrate 0.25 mL to 0.5 mL (5-10 mg) orally every 1 to 2 hours as needed for pain/dyspnea 16-Nov-15 10:48  dexamethasone 4 mg oral tablet 1 tab(s) orally 2 times a day 16-Nov-15 10:48  oxyCODONE 10 mg oral tablet, extended release 1 tab(s) orally every 12 hours 16-Nov-15 10:48  insulin aspart 100 units/mL subcutaneous solution  subcutaneous  13-Nov-15 12:45  QUEtiapine 25 mg oral tablet 1 tab(s) orally once a day (at  bedtime) 13-Nov-15 12:45  carvedilol 6.25 mg oral tablet 1 tab(s) orally 2 times a day 13-Nov-15 12:45  magnesium oxide 400 mg (241.3 mg elemental magnesium) oral tablet 1 tab(s) orally once a day 13-Nov-15 12:45  polyethylene glycol 3350 oral powder for reconstitution 17 gram(s) orally once a day 13-Nov-15 12:45  docusate sodium 100 mg oral capsule 1 cap(s) orally 2 times a day, As needed, Stool Softener  13-Nov-15 12:45  megestrol 40 mg/mL oral suspension 10 milliliter(s) orally 2 times a day 13-Nov-15 12:45  mirtazapine 45 mg oral tablet 1 tab(s) orally once a day (at bedtime) 02-Oct-15 14:00  ARIPiprazole 5 mg oral tablet 1 tab(s) orally once a day 01-Nov-15 22:58  aspirin 81 mg oral tablet, chewable 1 tab(s) orally once a day 01-Nov-15 22:58  zolpidem 5 mg oral tablet 1 tab(s) orally once a day (at bedtime), As needed, sleep 02-Oct-15 14:00  dronabinol 2.5 mg oral capsule 1 cap(s) orally 2 times a day 02-Oct-15 14:00  pentoxifylline 400 mg oral tablet, extended release 1 tab(s) orally 2 times a day 13-Oct-15 11:39  simvastatin 20 mg oral tablet 1 tab(s) orally once a day (at bedtime) 02-Oct-15 14:00  citalopram 40 mg oral tablet 1 tab(s) orally once a day 13-Oct-15 11:39  tamsulosin 0.4 mg oral capsule 1 cap(s) orally once a day 13-Oct-15 11:39  levothyroxine 50 mcg (0.05 mg) oral tablet 1 tab(s) orally once a day (in the morning) 02-Oct-15 14:00   Vital Signs:  :: Wt(KG): 76.5 Temp: 95.3 Pulse: 85 RR: 16  BP: 133/79   Physical Exam:  General: extremely apprehensive agitated gentleman not in acute distress  Mental Status: alert and oriented to person, place and time  Head, Ears, Nose,Throat: normal, no lesions or deformities  Neck, Thyroid: no thyroid tenderness, enlargement or nodule.  neck supple without massess or tenderness. no adenopathy.  Respiratory: Examination of chest: Air entry equal on both sides.  Emphysematous chest   Rhonchi: Not present   No wheezing    no rales  Cardiovascular: No murmur.  Heart sounds are within normal limit   No evidence of pericardial rub  Gastrointestinal: Abdomen: Soft   Liver not palpable   Spleen not palpable   No tenderness   No ascites   Bowel sounds are present  Musculoskeletal: No deformity..   No evidence of fracture..   No joint swelling..   Lower extremity no edema  Skin: no rashes, ulcers, or lesions  Neurological: Higher  functions have been normal limit.      Cranial nerves are intact   Motor system no evidence of localizing weakness   Sensory system no evidence of localizing weakness   No evidence of peripheral neuropathy  Lymphatics: no cervical, axillary, or inguinal lymphadenopathy   Laboratory Results: Hepatic:  15-Nov-15 16:18   Bilirubin, Total 0.2  Alkaline Phosphatase  2091 (46-116 NOTE: New Reference Range 11/05/13)  SGPT (ALT)  11 (14-63 NOTE: New Reference Range 11/05/13)  SGOT (AST) 19  Total Protein, Serum  6.2  Albumin, Serum  2.1  Routine Micro:  15-Nov-15 16:18   Micro Text Report URINE CULTURE   COMMENT                   NO GROWTH IN 8-12 HOURS   ANTIBIOTIC                       Specimen Source CLEAN CATCH  Culture Comment NO GROWTH IN 8-12 HOURS  Result(s) reported on  03 Mar 2014 at 10:43AM.  Routine Chem:  15-Nov-15 16:18   Glucose, Serum  205  BUN 18  Creatinine (comp)  1.37  Sodium, Serum 138  Potassium, Serum 4.2  Chloride, Serum 105  CO2, Serum 25  Calcium (Total), Serum  7.9  Osmolality (calc) 283  eGFR (African American) >60  eGFR (Non-African American)  54 (eGFR values <83mL/min/1.73 m2 may be an indication of chronic kidney disease (CKD). Calculated eGFR, using the MRDR Study equation, is useful in  patients with stable renal function. The eGFR calculation will not be reliable in acutely ill patients when serum creatinine is changing rapidly. It is not useful in patients on dialysis. The eGFR calculation may not be applicable to patients at the low and high extremes of body sizes, pregnant women, and vegetarians.)  Anion Gap 8  Routine UA:  15-Nov-15 16:18   Color (UA) Yellow  Clarity (UA) Cloudy  Glucose (UA) 50 mg/dL  Bilirubin (UA) Negative  Ketones (UA) Trace  Specific Gravity (UA) 1.018  Blood (UA) 3+  pH (UA) 6.0  Protein (UA) 100 mg/dL  Nitrite (UA) Negative  Leukocyte Esterase (UA) Negative (Result(s) reported on 02 Mar 2014 at  05:19PM.)  RBC (UA) 1176 /HPF  WBC (UA) 20 /HPF  Bacteria (UA) NONE SEEN  Epithelial Cells (UA) <1 /HPF (Result(s) reported on 02 Mar 2014 at 05:19PM.)  Routine Hem:  15-Nov-15 16:18   WBC (CBC) 7.0  RBC (CBC)  3.33  Hemoglobin (CBC)  8.6  Hematocrit (CBC)  27.7  Platelet Count (CBC) 234 (Result(s) reported on 02 Mar 2014 at 04:46PM.)  MCV 83  MCH  25.8  MCHC  31.0  RDW  18.0   Lab Results Review:  Lab Results     Assessment and Plan: Impression:   1. Carcinoma of prostate, mets to bone. patient has progressive disease.  Declining performance status.  Patient is very poor social situation.  Multiple psychiatric illness.taking pain medication on regular basis.agree with patients transferred to hospice home the symptoms can be better controlledhas refused any further radiation chemotherapy or anti-hormonal therapy for prostate cancer Diabetes poorly controlled  Plan:   Mr. Ramires has progressing carcinoma of prostate wide PSA criteriaincreasing bony painstart patient on fentanyl patch 25 mcg and breakthrough pain medicationsuggested that patient needs to get a bone scan followed by possible considered treatment with  XOFIGOrefused to get a bone scan in November.  Does not want any further treatment.  Does not want hospice or palliative care to helping to manage painnot want biphosphonate therapydiscuss situation with patient's daughter who is completely aware  patient's psychiatric problem.discussion for 30 min   Fax to Physician:  Physicians To Recieve Fax: Casilda Carls, MD - [phone].  Electronic Signatures: Jobe Gibbon (MD)  (Signed [id number] 13:44)  Authored: Note Type, History of Present Illness, CC/HPI, Review of Systems, ALLERGIES, PAST MEDICAL HISTORY, Preventive Screening, Smoking Cessation, Patient Family Social History, HOME MEDICATIONS, Vital Signs, Physical Exam, Lab Results Review, Assessment and Plan, Fax to Physician   Last Updated: 16-Nov-15 13:44 by  Jobe Gibbon (MD)

## 2014-08-09 NOTE — Consult Note (Signed)
Psychiatry: Follow up note for patient with depression and multimle medical problems. Patient consistantly denies any suicidal ideation and is lucid and not psychotic. Articulates an understanding of his medical issues, Agrees to outpt home health treatment. No longer meets commitment criteria. Social worker spoke with daughter. Case reviewed with ER attending. IVC discontinued and he can be released from ER.  Electronic Signatures: Clapacs, Jackquline DenmarkJohn T (MD)  (Signed on 14-Oct-15 16:28)  Authored  Last Updated: 14-Oct-15 16:28 by Audery Amellapacs, John T (MD)

## 2014-08-09 NOTE — Consult Note (Signed)
Brief Consult Note: Comments: PSychiatry: PAtient seen. Chart reviewed. Patient does not want to talk to me. Denies any suicidal ideation. Remains incapable of making any decisions for himself. Signing off. No need for psychiatric involvement.  Electronic Signatures: Audery Amellapacs, John T (MD)  (Signed [phone]-Nov-15 12:15)  Authored: Brief Consult Note   Last Updated: 16-Nov-15 12:15 by Audery Amellapacs, John T (MD)

## 2014-08-09 NOTE — Consult Note (Signed)
Psychiatry: Follow-up for this patient with depression.  States that he still continues to suffer and hurt all over.  Affect is dysphoric.  Thoughts slow and very negative.  Looks pained and unhappy.  Denies acute suicidal intention but continues to be hopeless and passively. continues to be very depressed poor insight and judgment poor cooperation.  I am glad to see that there may be an opening for geriatric psychiatry bed tomorrow.  Continue current medications.  Follow-up as needed.  Electronic Signatures: Taylen Osorto, Jackquline DenmarkJohn T (MD)  (Signed on 30-Sep-15 17:51)  Authored  Last Updated: 30-Sep-15 17:51 by Audery Amellapacs, Taneshia Lorence T (MD)

## 2014-08-09 NOTE — Discharge Summary (Signed)
PATIENT NAME:  [name], [name] MR#:  [id number] DATE OF BIRTH:  [birth year]  DATE OF ADMISSION:  01/07/2014 DATE OF DISCHARGE:  01/17/2014  DISPOSITION:  [facility].   Please refer to the interim discharge done by Dr. Imogene Burnhen.   DISCHARGE DIAGNOSES:  1.  Major depression.  2.  Generalized weakness.  3.  Prostate cancer.  4.  Hypertension.  5.  Type 2 diabetes mellitus. 6.  Coronary artery disease. 7.  Peripheral vascular disease.   DISCHARGE MEDICATIONS:  1.  Pentoxifylline 400 mg p.o. b.i.d.  2.  Seroquel 50 p.o. daily.  3.  Depakote 125 mg p.o. daily.  4.  Simvastatin 20 mg daily.  5.  Paxil 40 mg p.o. daily.  6.  Tamsulosin 0.4 mg daily.  7.  Levothyroxine 50 mg p.o. daily.  8.  Glipizide 5 mg daily.  9.  Percocet 5/325 mg 1 tablet every 6 hours as needed.  10.  Alprazolam 0.25 mg p.o. t.i.d.  11.  Coreg 12.5 mg p.o. daily.  12.  Aspirin 81 mg daily.  13.  Ambien 5 mg daily. 14.  Dronabinol 2.5 mg p.o. b.i.d.  15.  Mirtazapine 45 mg p.o. at bedtime. 16.  Abilify 5 mg p.o. daily.    DISPOSITION: The patient was referred to neuropsychiatry per psychiatrist, so discharge disposition was pending.   Please note that the patient's glyburide can be given only if the patient eats well.   HOSPITAL COURSE: This patient is a patient of Dr. Aurelio BrashJadali's. The patient is a 74 year old male with  a metastatic prostate cancer, coronary artery disease, hypertension, CHF, and diabetes, who had a fall at home. The patient was admitted for generalized weakness. The patient was started on IV fluids and his main complaint was body pains and lack of appetite and weakness. The patient had all the workup done, and he was thought to have major depression. Please refer to Dr. Elias Elseiamond's H and P. The patient's initial workup with CT abdomen did show any findings. Urinalysis showed some hematuria and pyuria, and his routine laboratories on admission showed sodium 129, AST  623, ALT 20. Chest x-ray showed a bibasilar lung opacities and skeletal metastases. We thought the weakness was secondary to metastatic disease and malnutrition, and the patient's workup has been negative. He was seen by psychiatry for depression, and the patient's does have agitation and confusion on and off. CT head was negative. The patient was seen by Dr. Toni Amendlapacs who said the patient can go to the neuropsychiatric unit. [facility] The patient was thought to have major depression secondary to bereavement due to the loss of his son recently due to accident, which happened 5 weeks ago. The patient had labile mood, been feeling  upset and angry.  The [facility].  TIME SPENT: More than 30 minutes.    ____________________________ Katha HammingSnehalatha Chanley Mcenery, MD sk:MT D: 01/22/2014 07:36:54 ET T: 01/22/2014 08:58:56 ET JOB#: [id number]  cc: Katha HammingSnehalatha Yadhira Mckneely, MD, <Dictator> Katha HammingSNEHALATHA Altie Savard MD ELECTRONICALLY SIGNED 01/25/2014 22:31

## 2014-08-09 NOTE — Consult Note (Signed)
Brief Consult Note: Diagnosis: major depression.   Patient was seen by consultant.   Consult note dictated.   Recommend further assessment or treatment.   Orders entered.   Comments: Psychiatry: Re-evaluation. Depressed. Refuses to cooperate with plan. Threats to kill self. Initiated IVC. Refer to gero-psych. Increase remeron to 45mg  qhs and add abilify 5mg  daily.  Electronic Signatures: Freada Twersky, Jackquline DenmarkJohn T (MD)  (Signed 29-Sep-15 13:12)  Authored: Brief Consult Note   Last Updated: 29-Sep-15 13:12 by Audery Amellapacs, Caitlin Hillmer T (MD)

## 2014-08-09 NOTE — Consult Note (Signed)
PSychiatry: PAtient seen. Chart reviewed. Case discussed yesterday with daughter. PAtient has no new complaint. Continues to report feeling bad but without specifics. REsists giving most other history with anger. Denies suicidal wish or hallucinations.  pained and constricted except when he gets suddenly angry. Thoughts slow and ill-formed. Minimal evidence of self care. is only selectively complient with medication. declined my suggestion that we try ketamine infusion for relief of depression.  this point my ability to offer much is minimal. PAtient refuses my suggestions to change anti-depressant medication and clearly gets no solace from my counceling. I will sign off active treatment but remain availible if needed. I continue to believe patient has no ability to make reasonable decisions for himself.  Electronic Signatures: Audery Amellapacs, Maccoy Haubner T (MD)  (Signed on 06-Nov-15 22:48)  Authored  Last Updated: 06-Nov-15 22:48 by Audery Amellapacs, Kiet Geer T (MD)

## 2014-08-09 NOTE — Discharge Summary (Signed)
PATIENT NAME:  [name], [name] MR#:  [id number] DATE OF BIRTH:  [birth year]  DATE OF ADMISSION:  02/17/2014 DATE OF DISCHARGE:  02/28/2014  CONSULTATIONS: Psychiatry palliative care.   ADMITTING DIAGNOSES:  1.  Severe debility with frequent falls.  2.  Metastatic prostate  cancer with diffuse metastasis to bone.  3.  Constipation.  4.  Depression.  5.  Hematuria from metastatic prostate cancer.   DISCHARGE DIAGNOSES:  1.  Adult failure to thrive.  2.  Metastatic malignant neoplasm from prostate cancer.  3.  Depression.  4.  Hematuria from metastatic prostate cancer.   CONSULTATIONS: Palliative care and psychiatry.   PROCEDURES: None.   BRIEF HISTORY AND PHYSICAL AND HOSPITAL COURSE: The patient is a 74 year old male with stage IV metastatic cancer, which is progressive in nature, currently on hormone therapy, also noted to be noncompliant with his medications. He is brought into the ED for severe debility.  Please review history and physical for details.   The patient was admitted to the hospital with severe debility with frequent falls. The patient was started on IV fluids. At the time of admission, the patient wanted to see palliative care, so palliative care was consulted. Unfortunately, the patient refused to talk to palliative care. This was discussed with the patient's daughter, Ms. [name].   During the hospital course, the patient was noticed to be severely depressed.  Psychiatry was consulted. The patient was not cooperative. Initially, psychiatry signed him off, but then psychiatry was reconsulted. Dr. Toni Amendlapacs has discussed with the patient's daughter regarding ketamine infusion, as the patient is not a good candidate for ECT. Daughter has refused ketamine infusion after discussing with her sister. They both think, as the patient has terminal cancer, this ketamine infusion is not going to help him with the depression. The patient had failure to thrive, reluctant to eat, and not  cooperative, refusing lab work during his hospital course.   Dr. Toni Amendlapacs has followed this patient during the hospital course. Subsequently, the patient was started on Megace. He was seen by a dietitian. The patient started taking Abilify and Megace, and subsequently, his situation improved a little bit. He started eating. After having several discussions with palliative care, psychiatry and the patient's family members, the plan was to transfer the patient to a skilled nursing facility with hospice care.   The patient does not have any medical power of attorney; the daughter [name], is legally the medical power of attorney. [name] is agreeable to transfer the patient to a skilled nursing facility with at [facility]. In the past 2 days, the patient's mood has been slightly better. He started eating some food. He wants to get physical therapy just for few days until he gets strong, as per my discussion today.   The patient initially was having nontraumatic rhabdomyolysis. IV fluids were given, and it has subsequently improved. The patient's hematuria was seemed to be from stage IV prostate cancer.  Plan is to continue physical therapy and increased p.o. intake and nutrition supplements at the skilled nursing facility.   The patient's hemoglobin dropped down to 6.9, which was secondary to hematuria and poor p.o. intake, and he has received 1 unit of blood transfusion. Subsequently, his  hemoglobin went up to 8.5.   The patient will be transferred to the skilled nursing facility.   LABORATORIES AND IMAGING STUDIES: On 02/26/2014, BMP is normal, except a calcium of 7.7 and a magnesium of 1.7. TSH is 1.06. WBC is normal, hemoglobin 8.5, hematocrit  27.0, platelets are 308,000.   MEDICATIONS AT THE TIME OF DISCHARGE: Pentoxifylline 400 mg 1 tablet p.o. 2 times a day, Simvastatin 20 mg p.o. at bedtime, citalopram 40 mg p.o. once daily, tamsulosin 0.4 mg once daily, levothyroxine 50 mcg p.o. once daily,  aspirin 81 mg p.o. once daily. Will hold if the patient develops hematuria again. Currently, the patient does not have any hematuria.  Zolpidem 5 mg p.o. at bedtime as needed for insomnia. Dronabinol 2.5 mg p.o. 2 times a day, mirtazapine 45 mg p.o. at bedtime, aripiprazole  5 mg p.o. once daily, oxycodone 5 mg p.o. every 6 hours as needed for pain, as well as sliding scale low-dose, quetiapine 25 mg p.o. at bedtime, Coreg 6.25 mg p.o. 2 times a day, magnesium oxide 400 mg p.o. once a day, polyethylene glycol 17 g p.o. once daily, Colace 100 mg p.o. 2 times a day as needed for stool softener, Megace 40 mg/mL 10 mL p.o. 2 times a day, Percocet 325 mg 1 tablets p.o. every 6 hours as needed for moderate pain.   DIET: ADA diet. Supplement with Glucerna. Regular consistency.   ACTIVITY: Continue physical therapy.   FOLLOWUP: Follow up with primary care physician in a week and psychiatry in a week.   Plan of care was discussed with the patient.   TOTAL TIME SPENT ON THE DISCHARGE: 45 minutes    ____________________________ Ramonita Lab, MD ag:MT D: 02/28/2014 15:40:58 ET T: 02/28/2014 16:35:37 ET JOB#: [id number]  cc: Ramonita Lab, MD, <Dictator> Ramonita Lab MD ELECTRONICALLY SIGNED 03/05/2014 22:13

## 2014-08-09 NOTE — Consult Note (Signed)
PATIENT NAME:  [name], [name] MR#:  [id number] DATE OF BIRTH:  1941/01/31  DATE OF CONSULTATION:  01/12/2014  CONSULTING PHYSICIAN:  Merik Mignano K. 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 The patient was admitted with problems related to prostate cancer. According to information obtained from the staff, the patient had been calling his daughter all the time and talking to her. He called her this morning and told her he "wants to kill himself." When the patient was asked about it, he reported that he did not mean it, did not state it, that he was upset and all he needs is rest and sleep. The undersigned went and woke him up when he was sleeping as he had not had enough rest in the past 3 days and has been feeling very tired.   PAST PSYCHIATRIC HISTORY: No previous history of  Inpt to psychiatry. No history of suicide attempt. He is not being followed by any psychiatrist.  SOCIAL HISTORY: Alcohol and drugs denied. Denies smoking nicotine cigarettes.   MENTAL STATUS: The patient is seen lying in bed. Alert and oriented to place, person and time. When questions are asked, he is irritable and upset, stating that he was tired as he did not have enough rest in the past 3 days. Affect is flat. Mood restricted. Admits to feeling upset and angry. He stated that he lost his son in a fire accident when he was only 74 years old. He has not yet recovered from the same. Denies any suicidal or homicidal idea or plan and stated, "If I kill myself, I'll go to hell. I do not want to go to hell. I want to live and get help." No psychosis. Does not appear to be responding to any stimuli. Memory is intact. Cognition is intact. Denies active suicidal or homicidal ideas or plan but gets very upset, irritable, angry and going through frustration related to his  son's recent death and bereavement related to the same. Insight and judgment are guarded. Impulse control is fair.   IMPRESSION: Major depression, secondary to bereavement, secondary to loss of his son at age 74 years in a fire accident, and this happened only 5 weeks ago. I recommend starting the patient on a low dose of antidepressant, Remeron 15 mg p.o. at bedtime which will help him rest and help him with his depression though the patient refuses to take any antidepressant. The patient does need counseling until he gets over the death of his son, and this is available through hospice where they will work with his bereavement and dealing with the same until he feels much better, and he should have somebody to talk to all the time if he feels frustrated so that he can ventilate and get over these feelings.   The staff nurse was informed about the same. Dr. Mitzi HansenMoody will be called and informed about the same.    ____________________________ Jannet MantisSurya K. Guss Bundehalla, MD skc:JT Harris: 01/12/2014 11:04:33 ET T: 01/12/2014 14:23:19 ET JOB#: [id number]  cc: Monika SalkSurya K. Guss Bundehalla, MD, <Dictator> Beau FannySURYA K Emilliano Dilworth MD ELECTRONICALLY SIGNED 01/31/2014 14:25

## 2014-08-09 NOTE — Consult Note (Signed)
PATIENT NAME:  [name], [name] DATE OF BIRTH:  08-13-1940  DATE OF ADMISSION: 07/09/2013  DATE OF CONSULTATION:  07/11/2013  REFERRING PHYSICIAN:  Jules SchickJanek Choksi, MD CONSULTING PHYSICIAN:  Ardeen FillersUzma S. Garnetta BuddyFaheem, MD  REASON FOR CONSULTATION: Suicidal ideation.   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 The patient appeared to be very anxious and has been noncompliant with his medications. He does not follow up with his physicians on a regular basis. He initially presented with his daughter. The patient has been having coffee-grounds emesis for the past 4 days. He did not want to come to the hospital, but his daughter brought him here. He stated that he does not like coming to the hospital. The patient's hemoglobin was found to have been dropped to 9.2. Psychiatric consult was placed as he reported that he does not feel well and wants to die.   During my interview, the patient was lying in the hospital bed. He reported that he feels depressed. Initially he stated that he does not want to talk to any psychiatrist. Later he agreed and reported that he lives by himself. He stated that he is in the hospital as they are going to remove some of his toes. He stated that they are going to remove 5 toes in one foot and 2 on the other. He stated that he has been feeling depressed about the same. He stated that he lives by himself and has several physicians but he does not see them on a regular basis. He does not remember the names of his physicians.   He stated that he does not want to take any psychotropic medications for his depression, although he admits to feeling depressed at this time. He stated that he has poor sleep at this time. He denied having any suicidal ideations at this time. He denied having any perceptual disturbances. He also mentioned feeling down,  hopeless, helpless at times.   PAST PSYCHIATRIC HISTORY: The patient denied having history of psychiatric hospitalization. He is feeling depressed.   PAST MEDICAL HISTORY: Stage IV prostate cancer, followed by Dr. Doylene Canninghoksi; diabetes mellitus; congestive heart failure; tonsillectomy; hypothyroidism; hypertension; coronary artery disease; hernia repair; cardiac stent.   ALLERGIES: No known drug allergies.   CURRENT HOME MEDICATIONS: Aspirin 81 mg daily, Plavix 75 mg daily, Lortab 5/325 every 4 to six hours as needed, glipizide 5 mg two times daily, metformin 1000 mg two times daily, alprazolam 0.25 mg t.i.d., ferrous sulfate 325 mg daily, docusate sodium 100 mg daily, pentoxifylline 400 mg b.i.d., fish oil 1000 mg p.o. daily, omeprazole 20 mg daily, multivitamin 1 tablet daily, folic acid 0.8 mg daily.   FAMILY HISTORY: He denied any family history of psychiatric illness.   SOCIAL HISTORY: The patient currently lives by himself. He has a daughter, [name], and I left a message for her to call me back so I can get all collateral information from her.   REVIEW OF SYSTEMS:  CONSTITUTIONAL: The patient admits to having fatigue, weakness. Denied any weight loss or weight change.  EYES: Denies any blurry or double vision.  ENT: Denies any tinnitus, hearing loss, epistaxis, or discharge.  RESPIRATORY: Denies wheezing, cough, hemoptysis.  CARDIOVASCULAR: Denies chest pain, edema.  GENITOURINARY: Denies dysuria, hematuria.  ENDOCRINE: Denies polyuria, polydipsia.  HEMOLYMPHATIC: Denies easy bruising or bleeding. INTEGUMENTARY: Denies acne or rash.  MUSCULOSKELETAL: Has  gangrenous toes which has been progressed over the last year.  NEUROLOGIC: No tremor, vertigo, or ataxia noted.   PHYSICAL EXAMINATION:  VITAL SIGNS: Temperature 99.5, pulse 97, respirations 18, blood pressure 135/56.   LABORATORY DATA: Glucose 133, BUN 18, creatinine 1.53, sodium 131, chloride 103, bicarbonate 21, anion gap 7,  calcium 272. Lipase 8.6,  protein 6.0, albumin 2.3, bilirubin 0.2, AST 32, ALT 14. WBC 12.3, RBC 3.42, hemoglobin 9.2, hematocrit 28.2. RDW 13.9.   MENTAL STATUS: A moderately built male who was lying flat in the bed. He maintains eye contact. His speech was low in tone and volume. Mood was depressed and anxious. Affect was congruent. Thought process was logical and goal-directed. Thought content was nondelusional. He admits of having depressive symptoms but denied having any suicidal or homicidal ideations or plans. He demonstrated fair insight and judgment.   DIAGNOSTIC IMPRESSION:  AXIS I:   1. Major depressive disorder, recurrent, moderate.                  2. Anxiety disorder.  AXIS III: Please review the medical history.   TREATMENT PLAN:  1.  I discussed his case at length about the gangrene as well as the depressive symptoms with his attending physician.  2.  The patient refuses to take any antidepressant at this time, but he admits to having depressive symptoms, but denies having any suicidal ideation or plan. His attending physician also reported that he is refusing other procedures as well. I left a message for his daughter, [name], so we can discuss about his guardianship as well as about the power of attorney. The patient will benefit from taking  antidepressant medication, but he is declining at this time.   Later I spoke at length with [name] about guardianship and she stated that they have never discussed with her father. She will come to the hospital and sicuss in detail. She agreed with the plan.    Thank you for allowing me to participate in the care of this patient.   ____________________________ Ardeen Fillers. Garnetta Buddy, MD usf:np D: 07/11/2013 15:44:08 ET T: 07/11/2013 16:45:40 ET JOB#: [id number]  cc: Ardeen Fillers. Garnetta Buddy, MD, <Dictator> Rhunette Croft MD ELECTRONICALLY SIGNED 07/15/2013 9:42

## 2014-08-09 NOTE — Consult Note (Signed)
Brief Consult Note: Comments: Psychiatry: Chart reviewed. PAtient is only partilly complient with oral medication. His illnesses make him high risk for more invasive treatment such as ECT and also at relatively high risk of delirium. Not a good candidate for that. Will continue follow up and attempts to form some rapport with patient.  Electronic Signatures: Audery Amellapacs, John T (MD)  (Signed [phone]-Nov-15 22:03)  Authored: Brief Consult Note   Last Updated: 04-Nov-15 22:03 by Audery Amellapacs, John T (MD)

## 2014-08-09 NOTE — Consult Note (Signed)
History of Present Illness:  Reason for Consult 1. Carcinoma prostate metastatic to bone stage IV disease  Progressive increase in PSA, Bone scan in November of 2013 shows multiple area of metastatic. 2. Started on Lupron and xgeva from March 21, 2012. patient did not want XGEVA  3.start patient XTANDI .  Patient has progressive increase in PSA.  Continues to be very anxious to progress.   HPI   74 year old gentleman who is extremely depressed ,very noncompliant [name] has a progress to carcinoma prostate was started on xtandi, he has missed several appointments withcancer clinic . also has poor compliance with his primary care physician.  Poor control of diabetes.admitted in the hospital with hematemesis and dropping hemoglobintaking Plavix and aspirin and ibuprofen .taking any medications for control of diabetes for a regular basis.  He continues to have bony pains.  PFSH:  Comments PFSH: Family History: noncontributory Social History: noncontributory Comments: Patien  lives by himself Additional Past Medical and Surgical History: Has been reviewed from multiple previous notes   Additional Past Medical and Surgical History Significant History/PMH: ??  chf:  ??  Prostate Cancer:  ??  Tonsillectomy:  ??  Depression:  ??  Hypothyroidism:  ??  Hypothyroidism:  ??  HTN:  ??  DM Type 2:  ??  CAD:  ??  Hernia Repair:  ??  Cardiac Stent:   Review of Systems:  General weakness  fatigue   Performance Status (ECOG) 1   HEENT no complaints   Lungs no complaints   Cardiac no complaints   GU difficulty urinating   Musculoskeletal back pain  muscle ache   Extremities no complaints   Skin no complaints   Neuro no complaints   Psych anxiety  depression   Review of Systems   Patient is very agitated very poor historiandepressed on suicide watchalready received 2 units of packed cells    chf:    Prostate Cancer:    Tonsillectomy:    Depression:    Hypothyroidism:     HTN:    DM Type 2:    CAD:    Hernia Repair:    Cardiac Stent:    No Known Allergies:     acetaminophen-hydrocodone 325 mg-5 mg oral tablet: 1 tab(s) orally every 4 to 6 hours, As Needed, Status: Active, Quantity: 60, Refills: None   Xtandi 40 mg oral capsule: 1 tab(s) orally once a day, Status: Active, Quantity: 30, Refills: 3   glipiZIDE 5 mg oral tablet: 1 tab(s) orally 2 times a day, Status: Active, Quantity: 0, Refills: None   pentoxifylline 400 mg oral tablet, extended release: 1 tab(s) orally 2 times a day, Status: Active, Quantity: 0, Refills: None   folic acid 0.8 mg oral tablet: 1 tab(s) orally once a day, Status: Active, Quantity: 0, Refills: None   ferrous sulfate 325 mg oral tablet: 1 tab(s) orally once a day, Status: Active, Quantity: 0, Refills: None   ALPRAZolam 0.25 mg oral tablet: 1 tab(s) orally 3 times a day, Status: Active, Quantity: 0, Refills: None   metFORMIN 1000 mg oral tablet: 1 tab(s) orally 2 times a day, As Needed, Status: Active, Quantity: 0, Refills: None   aspirin 81 mg oral tablet: 1 tab(s) orally once a day, Status: Active, Quantity: 0, Refills: None   omeprazole 20 mg oral delayed release capsule: 1 cap(s) orally once a day, Status: Active, Quantity: 0, Refills: None   docusate sodium sodium 100 mg oral capsule: 1 cap(s) orally 2 times a day, Status: Active,  Quantity: 0, Refills: None   clopidogrel 75 mg oral tablet: 1 tab(s) orally once a day, Status: Active, Quantity: 0, Refills: None   Fish Oil 1000 mg oral capsule: 1 cap(s) orally once a day, Status: Active, Quantity: 0, Refills: None   One Daily with Minerals Therapeutic Multiple Vitamins with Minerals oral tablet: 1 tab(s) orally once a day, Status: Active, Quantity: 0, Refills: None  Laboratory Results: Hepatic:  24-Mar-15 09:36   Bilirubin, Direct 0.2 (Result(s) reported on 09 Jul 2013 at 02:54PM.)  Bilirubin, Total  1.2  Alkaline Phosphatase  681 (45-117 NOTE: New  Reference Range 03/08/13)  SGPT (ALT) 14.  SGOT (AST) 32  Total Protein, Serum  6.0  Albumin, Serum  2.3  Routine Chem:  24-Mar-15 09:36   Glucose, Serum  228  BUN  19  Creatinine (comp)  1.63  Sodium, Serum  135  Potassium, Serum 4.2  Chloride, Serum 104  CO2, Serum 24  Calcium (Total), Serum 8.6  Osmolality (calc) 280  eGFR (African American)  48  eGFR (Non-African American)  41 (eGFR values <63m/min/1.73 m2 may be an indication of chronic kidney disease (CKD). Calculated eGFR is useful in patients with stable renal function. The eGFR calculation will not be reliable in acutely ill patients when serum creatinine is changing rapidly. It is not useful in  patients on dialysis. The eGFR calculation may not be applicable to patients at the low and high extremes of body sizes, pregnant women, and vegetarians.)  Anion Gap 7  Routine UA:  24-Mar-15 00:35   Color (UA) Yellow  Clarity (UA) Hazy  Glucose (UA) >=500  Bilirubin (UA) Negative  Ketones (UA) Negative  Specific Gravity (UA) 1.026  Blood (UA) Negative  pH (UA) 5.0  Protein (UA) Negative  Nitrite (UA) Negative  Leukocyte Esterase (UA) Negative (Result(s) reported on 09 Jul 2013 at 01:19AM.)  RBC (UA) NONE SEEN  WBC (UA) <1 /HPF  Bacteria (UA) NONE SEEN  Epithelial Cells (UA) NONE SEEN  Mucous (UA) PRESENT (Result(s) reported on 09 Jul 2013 at 01:19AM.)  Routine Hem:  24-Mar-15 09:36   Hemoglobin (CBC)  10.7 (Result(s) reported on 09 Jul 2013 at 09:55AM.)   Assessment and Plan: Impression:   1.  Hematemesis.etiology not clear.  Not related to carcinoma prostate were present medication with  XTANDI.  The patient was seen by GI consultant and recommended upper endoscopy.  Patient refused.  Carcinoma of prostate , CASTRATION  resistant disease.XTANDI .  Diabetes poorly controlleddoes not check blood sugar nor take medication on a regular basis Severe depression.  Patient has refused to see psychiatrist in the  past.suicidal ideation for several years.  Plan:   I will try   to convince him to get upper endoscopy done . consultation will be very useful recommended this patient  I will  follow this patient along with you  Xtandi at present time   Electronic Signatures: Sophya Vanblarcom, JMartie Lee(MD)  (Signed 24-Mar-15 15:56)  Authored: HISTORY OF PRESENT ILLNESS, PFSH, ROS, PAST MEDICAL HISTORY, ALLERGIES, HOME MEDICATIONS, LABS, ASSESSMENT AND PLAN   Last Updated: 24-Mar-15 15:56 by CJobe Gibbon(MD)

## 2014-08-09 NOTE — Consult Note (Signed)
Psychiatry: PAtient seen and chart reviewed. Case discussed with hospitalist. PAtient with depression and advanced stage cancer. I have tried to work with him on improving treatment for depression and he has resisted changes in medication. Today the patient would not look at me and would only say "about the same" and then close his eyes. Refused to discuss with me any options for treatment of depression. I think patient would be high risk of delirium and of complications with ECT. Would be glad to revisit possible ketamine infusion with family if they wish. Procedure is off-label but has some evidence to suggest benifit to mood and relatively low risk. Other wise will follow as needed.  Electronic Signatures: Clapacs, Jackquline DenmarkJohn T (MD)  (Signed on 10-Nov-15 21:37)  Authored  Last Updated: 10-Nov-15 21:37 by Audery Amellapacs, John T (MD)

## 2014-08-09 NOTE — Discharge Summary (Signed)
PATIENT NAME:  [name], [name] MR#:  [id number] DATE OF BIRTH:  05-Mar-1941  DATE OF ADMISSION:  07/09/2013  DATE OF DISCHARGE:  07/18/2013  For a detailed note, please look at the history and physical done on admission by Dr. Randol KernElgergawy.   DISCHARGE DIAGNOSES:  Suspected gastrointestinal bleed now ruled out. 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 Diabetes, medical noncompliance, peripheral vascular disease, depression. History of prostate cancer.   DIET: The patient is being discharged on [employer] diabetic [employer] low-sodium, low-fat diet.   ACTIVITY: As tolerated.   FOLLOWUP:   Dr. Recardo EvangelistMatthew Troxler in the next 3-4 days. Also follow with Dr. Dario GuardianJadali in the next 1-2 weeks.    DISCHARGE MEDICATIONS: As follows: Glipizide 5 mg b.i.Harris. pain, Xtandi 40 mg daily, pentoxifylline 400 mg b.i.Harris., folic acid 0.8 mg daily, iron sulfate 25 mg daily, metformin 1000 mg b.i.Harris., aspirin 81 mg daily, Colace 100 mg b.i.Harris., fish 1000 mg daily, multivitamin daily, Tylenol with hydrocodone 5/325 one tablet q. 4 hours as needed, Zoloft 100 mg at bedtime, Seroquel 50 mg at bedtime, Xanax 0.25 mg t.i.Harris., Protonix 40 mg b.i.Harris., Augmentin 875 mg b.i.Harris. x 7 days.   CONSULTANTS DURING THE HOSPITAL COURSE:  Dr. Dow AdolphMatthew Rein from gastroenterology, Dr. Linus Galasodd Cline and Recardo EvangelistMatthew Troxler from podiatry, Dr. Jennet MaduroPucilowska from psychiatry.   PERTINENT STUDIES DONE DURING THE HOSPITAL COURSE:  Are as follows: A chest x-ray done on admission showing minimal opacity in the left lower lobe possible atelectasis. The patient's blood cultures essentially negative.   HOSPITAL COURSE: This is a 74 year old male with medical problems as mentioned above, presented to the hospital due to coffee ground emesis. A drop in his hemoglobin. Also noted to have gangrene of his left lower extremities.   1.  Suspected upper GI bleed. The patient presented  to the hospital with coffee ground emesis and a drop in his hemoglobin. The patient was started on a Protonix drip. His serial hemoglobins were followed. Gastroenterology consult was obtained and the patient was seen by Dr. Shelle Ironein. The patient did not have any further evidence of acute bleeding. He had refused endoscopy. He was taken off his aspirin and Plavix and pentoxifylline. He has had no further bleeding or coffee ground emesis while in the hospital. At this point, the patient is being discharged just on aspirin and proton pump inhibitor b.i.Harris. He likely would benefit from an endoscopy, but can be done as an outpatient when he actually agrees to it.  2.  Bilateral severe peripheral vascular disease, status post left transmetatarsal amputation and right 5th and 2nd toe amputation. The patient had significant gangrene in his left lower extremity and also noted to have osteomyelitis of his right foot. The patient was seen by podiatry. Empirically the patient was started on IV vancomycin and Zosyn. His blood cultures remained negative. After being seen by podiatry, the patient underwent a transmetatarsal amputation of the left foot along with amputation of the right 5th and 2nd toe. The patient is postoperative day 6.  His pain has been well controlled on some p.r.n. Norco. His wound cultures have remained negative. He initially had a wound VAC, which has now been discontinued. At this point, we are going to continue local wound care and he is being discharged empirically on oral Augmentin for the next 7 days. He will have a follow-up with Dr. Orland Jarredroxler from podiatry next week.  3.  History  of prostate cancer. The patient is followed by Dr. Doylene Canning. He is on Xtandi. He will continue that. There is no acute issue related to this at this time.  4.  Diabetes. The patient's blood sugars have  remained somewhat labile while in the hospital. He is significantly noncompliant with his medications. At this point, he will  continue his glipizide as stated. Further titrations to his antidiabetic medications can be done as an outpatient. He was also on metformin, which he can continue.  5.  Depression. The patient had periods of agitation and suicidal ideations while in the hospital. He also had at some point had some homicidal ideations as he threatened his daughter. The patient was seen by psychiatry. They did not think the patient was actively suicidal. He had a sitter while in the hospital through most of the hospital course, but it was discontinued 24 hours prior to discharge. Presently, the patient denies any suicidal ideations. He was started on some Zoloft, Xanax, and Seroquel, which I am currently discharging him on. He likely would benefit from seeing a psychiatrist as an outpatient, but it is entirely up to him. The patient apparently came from home, but his living conditions are quite unsanitary and he is not safe to be discharged there. Therefore, he currently is being discharged to a skilled nursing facility for ongoing care.   CODE STATUS:  The patient is a full code.   TIME SPENT:  45 minutes.  ____________________________ [name]. Cherlynn Kaiser, MD vjs:tc Harris: 07/18/2013 12:15:27 ET T: 07/18/2013 12:32:34 ET JOB#: [id number]  cc: [name]. Cherlynn Kaiser, MD, <Dictator>             [name]. Troxler, DPM             Rickey Corporal, MD  Houston Siren MD ELECTRONICALLY SIGNED 07/28/2013 18:42

## 2014-08-09 NOTE — Consult Note (Signed)
PATIENT NAME:  [name], DILEONARDO MR#:  [id number] DATE OF BIRTH:  [birth year]  DATE OF CONSULTATION:  07/14/2013  REFERRING PHYSICIAN:   CONSULTING PHYSICIAN:  Farris Blash K. Guss Bunde, MD  SEX: Male.  RACE: White.  REASON FOR CONSULTATION: Depression with suicidal ideation and presently takes medication.  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 The patient is retired after working for [employer] for 20 years. The patient is divorced for 5 years after being married for 45 years and currently lives by himself in a house and it has been very difficulty taking care of the house. The patient reports "The reason I'm here is because of my wife leaving and my son who is on drugs and alcohol is the problem for the same".  PAST PSYCHIATRIC HISTORY: No H/O previous inpt to  psychiatry. No history of suicide attempts. No being involved with a psychiatrist. The patient reports that he had had problems taking care of himself since his wife left him with a divorce and the main reason being the son who is on drugs and alcohol. The patient is seen with 1-on-1 observation.  MEDICAL HISTORY: As stated for prostate cancer and being followed by Dr. Doylene Canning and diabetes mellitus and patient reports in fact that he was admitted to get his toes chopped off. In addition, he has congestive heart failure, tonsillectomy, hypothyroidism, hypertension, coronary artery disease, hernia repair, and cardiac stents.   ALLERGIES: No known drug allergies.  CURRENT MEDICATIONS: Are as follows: Aspirin 81 mg daily, Plavix 75 mg daily, Lortab 5/325 mg every 4 to 6 hours as needed for pain, glipizide 5 mg twice a day, metformin 1000 mg twice a day, Xanax 0.25 mg 3 times a day, ferrous sulfate 325 mg daily, docusate sodium 100 mg daily, pentoxifylline 400 mg twice a day, fish oil 1000 mg daily, omeprazole 20 mg daily,  multivitamin 1 daily, folic acid 0.8 mg daily.  MENTAL STATUS EXAMINATION: The patient is laying in bed and being watched by 1-on-1 observation status. Alert and he is oriented. Very frustrated and angry and upset about being interviewed so many times by psychiatrists. He is irritable when questions are being asked. He reported that the reason for him coming here was to get his toes chopped off. He reports that ever since he was divorced all the problems started because of him not able to care for himself and getting harder and harder to live by himself and take care of himself. He reports that his living condition is not good because he is not able to care for the house. When it was mentioned about idea of going to assisted living he reported "It is coming to that stage, I guess" and yelled at the undersigned. Does admit feeling depressed, does admit feeling frustrated and low and down but denies any suicidal or homicidal idea or plan and he reported "I will never do that, I will never kill myself". He refuses to take any antidepressant medications and when it is mentioned about antidepressant he got very angry and yelled at the undersigned and stated he does not want to do that and will never go on antidepressants. Does not appear to have any psychotic symptoms. Insight and judgement guarded.  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 Anxiety disorder.   RECOMMENDATION AND PLAN: Patient absolutely  denies going on any antidepressant medications at this time. He realizes that he has to go to assisted living where he will get help and supervision with his care. The patient should be considered to be placed in an assisted living facility where he will improve because once he his around people and once he is getting help for his problem, he will start feeling hopeful and then his depression will become less and then  probably he will start participating in the treatment for his depression. He does need a change in his mileue where improvement is possible with his depression as current living situation is not congenial for him as he has lots of physical problems and has no help at all.   ____________________________ Jannet MantisSurya K. Guss Bundehalla, MD skc:lt D: 07/14/2013 18:36:03 ET T: 07/15/2013 00:41:28 ET JOB#: [id number]  cc: Monika SalkSurya K. Guss Bundehalla, MD, <Dictator> Beau FannySURYA K Hyden Soley MD ELECTRONICALLY SIGNED 07/15/2013 9:40

## 2014-08-09 NOTE — Consult Note (Signed)
PATIENT NAME:  [name], [name] MR#:  [id number] DATE OF BIRTH:  [birth year]  DATE OF CONSULTATION:  07/11/2013  CONSULTING PHYSICIAN:  Roshawn Ayala D. Juliann Pares, MD  PRIMARY PHYSICIAN: unknown  ONCOLOGIST: Johney Maine, MD  PODIATRIST: Linus Galas, DPM  INDICATION: Abnormal EKG, preoperative assessment for foot surgery with peripheral vascular disease.   HISTORY OF PRESENT ILLNESS: Mr. Casher is a 74 year old white male with a known history of stage IV prostate cancer with bone mets currently on Xtandi for chemotherapy. The patient presents with complaint of cough without emesis. The patient appears to be very anxious, history of noncompliance as well. He does not follow up with the physicians as outpatient. The patient reports he has had coffee-ground emesis in the last 4 days.  come to the hospital. Reportedly daughter went with law enforcement to the home where the found him lying in his own vomit at home, where they called EMS and thus finally agreed to come. The patient's hemoglobin was found to be 9 from 14. The patient had dry crusted blood around his mouth. Plus he had dark-colored stools. Known to have history of peripheral vascular disease, has been to [facility] for evaluation.   He has had gangrene on his toes followed by podiatry. Scheduled for amputation, slightly tachycardic, has a history of hypoglycemia, chronic renal insufficiency, and was supposed to be in the operating room but had elevation of his  with EKG so anesthesia postponed the case until cardiology evaluation can be done. The patient denies any chest pain, short of breath, but has significant pain in his legs and feet, for preoperative clearance for evaluation and treatment.   ALLERGIES: None.   MEDICATIONS: Aspirin 81 mg a day, Plavix 75 a day, Lortab 5/325 mg every 6 hours p.r.n., glipizide 5 twice a day, metformin 1000 twice a day, Xtandi 40 mg a day, alprazolam 0.25 one tablet 2 times a day, ferrous sulfate 325 a day,   100 mg  daily, pentoxifylline 400 mg twice a day, fish oil, omeprazole 20 daily, multivitamin 1 daily, folic acid 0.8 mg daily.  PAST MEDICAL HISTORY: Prostate cancer, diabetes, congestive heart failure, depression, hypothyroidism, hypertension, coronary artery disease.  PAST SURGICAL HISTORY: Cardiac stents, hernia repair, tonsillectomy.  FAMILY HISTORY: Negative.   SOCIAL HISTORY: He lives alone. He denies any smoking or alcohol consumption.  REVIEW OF SYSTEMS:  GENERAL: Weakness, fatigue, leg discomfort, peripheral vascular disease, cellulitis, minimal energy, preop for surgery. No heart failure. No syncope.   PHYSICAL EXAMINATION:  VITAL SIGNS: Blood pressure 140/70, pulse ox 95, respiratory rate is 16. Afebrile.  HEENT: Normocephalic, atraumatic. Pupils equal and reactive to light.  NECK: Supple. No significant JVD, bruits, or adenopathy. LUNGS: Clear to auscultation and percussion. No significant wheeze,  rhonchi, or rale. HEART: Regular rate and rhythm. Systolic ejection murmur along the right sternal border. Positive S4. PMI nondisplaced.  ABDOMEN: Benign. EXTREMITIES:  bandage to the lower extremities and feet preoperative of surgery. Unable to fully assess.  NEUROLOGIC: Intact.  SKIN: Normal.   LABORATORIES: Glucose 442, BUN 20, creatinine 1.66, sodium 130, potassium 4.3, chloride of 100. LFTs negative. Troponin is 0.04. White count of 12.3, hemoglobin of 9, hematocrit 28, platelet count of 369,000.  ASSESSMENT: Preoperative for  surgery on peripheral vascular disease and diabetes, elevated  liver enzymes, stage IV prostate cancer, gastroesophageal reflux disease, possible upper gastrointestinal bleed.   PLAN: We will evaluate his cardiac status preop for surgery because of abnormal EKG and diffuse ST segment depression, We will treat  for DVT prophylaxis but must careful about anticoagulation with his possible gastrointestinal bleed. Recommended gastrointestinal evaluate 3, recommended  followup hemoglobin and hematocrits. Will evaluate an echocardiogram to assess his preoperative risk for surgery. Consider antibiotics for possible cellulitis  peripheral vascular disease and need for adequate arterial circulation. Continue treatment for his prostate cancer as per Dr. Doylene Canninghoksi. Consider psychiatric evaluation for possible suicidal ideation. Would maintain a very conservative cardiac approach. Because of his comorbid state, do not recommend cardiac catheterization. Continue evaluation for renal insufficiency. Would consider whether hydration be helpful. We will continue to follow the patient with you to assess his overall preoperative risks  history of known coronary disease, PCI, and stent both of which were done at [facility] years ago.   ____________________________ Bobbie Stackwayne D. Juliann Paresallwood, MD ddc:lt D: 07/12/2013 00:27:40 ET T: 07/12/2013 03:18:38 ET JOB#: [id number]  cc: Erabella Kuipers D. Juliann Paresallwood, MD, <Dictator> Alwyn PeaWAYNE D Idy Rawling MD ELECTRONICALLY SIGNED 08/16/2013 13:02

## 2014-08-09 NOTE — Consult Note (Signed)
Psychiatry: PAtient with depression and cancer. Today his cheif complaint is being very tired. Denies any change in pain level. Eating a little. was more alert and interactive than when I last saw him. He was polite and seemed less angry.he complains of being tired, he actually seems a little more alert today. I see he has been more complient with medication and wonder if taking the abilify may have helped some. No change to medication. Will follow. Unclear when they will be able to place him.  Electronic Signatures: Audery Amellapacs, John T (MD)  (Signed on 11-Nov-15 21:07)  Authored  Last Updated: 11-Nov-15 21:07 by Audery Amellapacs, John T (MD)

## 2014-08-09 NOTE — H&P (Signed)
PATIENT NAME:  [name], [name] MR#:  [id number] DATE OF BIRTH:  [birth year]  DATE OF ADMISSION:  01/07/2014  REFERRING PHYSICIAN:  Rebecka Apley, MD.  PRIMARY CARE PHYSICIAN:  Marlyn Corporal, MD.  ADMISSION DIAGNOSIS:  Weakness, hypoxia, and hyponatremia.   HISTORY OF PRESENT ILLNESS: This is a 74 year old Caucasian gentleman who is brought to the Emergency Department by EMS after a fall in his home. The patient was getting up to use the restroom and apparently slipped in his socks. He fell and hurt his left side. He does not recall what he hit, but he now complains of some posterior lower rib pain. The patient is not a very good historian as he is very agitated and sometimes confused. He has metastatic prostate cancer and also has gross hematuria. This has happened before. The patient denies pain with urination. His main complaint at this time is lack of appetite. He is also focused on whether his stool softener is in his bag of medicines, but he cannot endorse constipation. His vital signs also revealed mild hypoxia. For these reasons, the Emergency Department called for admission.   REVIEW OF SYSTEMS:  Not clear, as the patient is not consistent with his answers, but he admits to a decrease in appetite, generalized weakness, occasional back pain, as well as gross hematuria. He denies suicidal ideation, but admits that he is depressed and very saddened by the recent deaths of his son as well as his wife.   PAST MEDICAL HISTORY:  Metastatic prostate cancer, coronary artery disease, hypertension, CHF, peripheral vascular disease, diabetes type 2, kidney stones, hypothyroidism, and depression.   PAST SURGICAL HISTORY:  Transmetatarsal amputation of the left foot, hernia repair, recent lithotripsy, cardiac stents, and tonsillectomy.   SOCIAL HISTORY:  The patient lives alone. He denies tobacco, alcohol or drug use.   FAMILY HISTORY:  Significant for diabetes type 2 as well as heart  disease.  MEDICATIONS REPORTED BY THE PATIENT:  1.  Alprazolam 0.25 mg 1 tablet p.o. t.i.d.  2.  Citalopram 40 mg 1 tablet p.o. daily.  3.  Divalproex sodium 125 mg delayed release, 2 tablets p.o. daily at bedtime.  4.  Glipizide extended release 5 mg 1 tablet p.o. daily.  5.  Ibuprofen 800 mg 1 tablet p.o. t.i.d. as needed for pain.  6.  Levothyroxine 50 mcg 1 tablet p.o. every morning.  7.  Oxycodone 5 mg 1-2 tablets p.o. every 6 hours as needed for pain.  8.  Pentoxifylline 400 mg extended release 1 tablet p.o. b.i.d.  9.  Percocet 5/325 mg 1 tablet p.o. every 6 hours as needed for pain.  10. Seroquel 50 mg 1 tablet p.o. at bedtime.  11. Simvastatin 20 mg 1 tablet p.o. at bedtime.  12. Tamsulosin 0.4 mg 1 capsule p.o. daily.  13. Xtandi 40 mg 1 tablet p.o. daily, per the patient's last discharge summary.  14. Folic acid 0.8 mg 1 tablet p.o. daily.  15. Iron sulfate 25 mg 1 tablet p.o. daily.  16. Metformin 1000 mg 1 tablet p.o. b.i.d.  17. Aspirin 81 mg 1 tablet p.o. daily.  18. Colace.  19. Fish oil.  20. Multivitamin.   The patient admits that he has refused to take some of his medicines and that his primary care doctor has changed some other medicines.   ALLERGIES: No known drug allergies.   PERTINENT LABORATORY RESULTS, RADIOGRAPHIC FINDINGS:  Serum glucose is 243, BNP 1468, BUN 29, creatinine 1.68, sodium 129, potassium 4.7, chloride  97, bicarbonate 24, calcium is 8.7, serum albumin is 2.8, total serum bilirubin 0.4, alkaline phosphatase 1352, AST is 623, ALT is 20, troponin is negative, white blood cell count is 9.3, hemoglobin 9.8, hematocrit 28.8.   Urinalysis shows gross hematuria as well as pyuria but negative nitrites as well as leukocyte esterase. There is also heavy protein in the urine.   CT of the abdomen and pelvis shows no traumatic findings to explain hematuria. There is a left ureteral stent with resolved hydronephrosis and pneumaturia, which is likely left over  from instrumentation. Also, there are diffuse osseous metastases that are progressive since the last scan. There are small bilateral pleural effusions with atelectasis.   Head CT shows age-related involutional change with no acute findings.   Chest x-ray shows bibasilar lung opacities as well as skeletal metastases.   PHYSICAL EXAMINATION:  VITAL SIGNS: Temperature 98.8, pulse 87, respirations 18, blood pressure 172/83, pulse oximetry 99% on 2 liters of oxygen via nasal cannula.  GENERAL: The patient is alert. He is oriented x 2 with intermittent confusion of situation/surroundings. He is in no apparent distress at this time.  HEENT: Normocephalic, atraumatic. Pupils equal, round, and reactive to light and accommodation. Extraocular movements are intact. There is no scleral icterus. Mucous membranes are somewhat tacky. NECK: Trachea is midline. No adenopathy.  CHEST: Symmetric and atraumatic.  CARDIOVASCULAR: Regular rate and rhythm. Normal S1, S2. No rubs, clicks, or murmurs appreciated.  LUNGS: Clear to auscultation bilaterally. Normal effort and excursion.  ABDOMEN: Positive bowel sounds. Soft, nontender, nondistended. No hepatosplenomegaly.  GENITOURINARY: Diaper is in place.  MUSCULOSKELETAL: The patient moves all 4 extremities equally. He does not cooperate with strength testing.  SKIN: Jaundiced in color, but no rashes, but there are some kind of strawberry-sized abrasions of his posterior middle back on the left.  EXTREMITIES: No clubbing, cyanosis, or edema. He does have some bruises on his knees bilaterally.  NEUROLOGIC: Cranial nerves II-XII grossly intact. The patient cannot participate with cerebellar function testing.  PSYCHIATRIC: The patient is very agitated. His mood is sometimes tearful and affect is congruent.   ASSESSMENT AND PLAN:  This is a 74 year old male with metastatic prostate cancer who is being admitted for weakness, hypoxia and hyponatremia.  1.  Weakness. Likely  related to the patient's metastatic disease and to his malnutrition. He has very little subcutaneous fat. Our goal is to manage his pain and to stimulate his appetite. I have added Marinol to his inpatient regimen.  2.  Hypoxia. It is mild at this point. We will supply supplemental O2 as needed. There is no leukocytosis or cough. His intermittent tachypnea not enough at this time to convince me that he  clinically has pneumonia. We will keep a close eye on his respiratory status and start antibiotics if needed.  3.  Hyponatremia. Likely from volume depletion. We will hydrate the patient and allow him to eat by mouth and encourage water intake.  4.  Prostate cancer. The patient has bony metastases. This is also likely the cause of his gross hematuria. The patient's calcium is normal at this time. It is unclear if he should still be on his antiandrogen medication. It is unlikely that it has a high clinical yield, which is why I have also ordered a palliative care consult.  5.  Hypothyroidism. The patient is noncompliant with his levothyroxine as it has not been filled since last year. I have restarted the last dose of this medicine and I will check a  TSH to see if we need to adjust dosing.  6.  Agitation and confusion. The patient's head CT was negative for any acute process, but his high level of anxiety and intermittent confusion could be related to either thyroid disease or electrolyte imbalances. I have added some Ambien for bedtime and adjusted his home regimen of Seroquel such that he can have the latter any time of day once daily for agitation.  7.  Diabetes mellitus, type II. We will place the patient on sliding scale insulin for now. His oral hypoglycemics have been held.  8.  Hypertension. The patient does not have any antihypertensives on his home medications list. This could possibly be due to the fact that he is a constant fall risk. I have started the patient on Coreg during his hospitalization.   9.  Congestive heart failure. The patient's BNP is elevated, but he is clinically stable.  10. Coronary artery disease. Continue the patient's statin for risk reduction.  11. Peripheral vascular disease. I have held the patient's pentoxifylline due to gross hematuria.  12. Depression. Continue citalopram.  13. DVT prophylaxis. SCDs.  14. GI prophylaxis. Pantoprazole.   The patient is currently a full code, but as mentioned before, I have ordered a palliative care consult.   Time spent on admission orders and patient care: Approximately 45 minutes.    ____________________________ Kelton Pillar. Sheryle Hail, MD msd:lt D: 01/07/2014 07:19:04 ET T: 01/07/2014 07:42:07 ET JOB#: [id number]  cc: Kelton Pillar. Sheryle Hail, MD, <Dictator> Kelton Pillar Angelica Wix MD ELECTRONICALLY SIGNED 01/11/2014 0:53

## 2014-08-09 NOTE — Consult Note (Signed)
PATIENT NAME:  [name], [name]#:  [id number] DATE OF BIRTH:  10/24/1940  DATE OF CONSULTATION:  01/09/2014  REFERRING PHYSICIAN:   CONSULTING PHYSICIAN:  Audery AmelJohn T. Demri Poulton, MD  INFORMATION AND REASON FOR CONSULTATION: This is a 74 year old man currently in the hospital because of falls and declining health at home. Consult because of concern about major depression.   HISTORY OF PRESENT ILLNESS: Information obtained from interview with the patient, review of the chart. The patient was interviewed yesterday, but was essentially refusing to talk with me. He was re-interviewed today and was slightly more cooperative. The patient endorses that he feels depressed and down all the time, feels very hopeless, cannot articulate anything in his life that he enjoys. He has had recent severe stresses. He had a son who died in a fire just within the last month or so. Wife died not too long ago. He has been angry at some of the other members of his family. Additionally, he has severe medical problems including prostate cancer, coronary artery disease, COPD, diabetes. The patient claims that he has been compliant with his medication at home, but on admission it sounds like pill bottles were very out of date and there was good reason to think he had not been compliant with medicine. He denies any intention to harm himself, but says that he wishes he were dead and has thoughts about how he would be better off dead. Denies that he is having any hallucinations. There is concern that he has been taking care of himself poorly at home. He has been noncooperative with myself and also noncooperative with social worker who have tried to work on discharge planning.   PAST PSYCHIATRIC HISTORY: The patient was seen earlier this year by Dr. Jennet MaduroPucilowska in consult when he was in the hospital. The patient seems to have been depressed, probably intermittently for years. It does not sound like he has had a history of suicide attempts.  Denies ever having had psychiatric hospitalization. He has been treated with antidepressants in the past, but mostly he has been on citalopram recently, it is either not working or he has not been cooperative.   SOCIAL HISTORY: Currently has been back living at home alone. He has a daughter around, but he stays irritated with her. She tries to be helpful and support him. He had a son who died just recently in a fire. Has had multiple other losses in his life. Very limited social contact.   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   SUBSTANCE ABUSE HISTORY: He denies that he is drinking or using any drugs, denies any past substance abuse history.   FAMILY HISTORY: Denies any family history of mental illness.   CURRENT MEDICATIONS: On admission, he was on Xanax 0.25 mg 3 times a day, Celexa 40 mg a day, Depakote 125 mg twice a day, glipizide 5 mg once a day, ibuprofen 800 three times a day as needed for pain, levothyroxine 50 mcg a day, oxycodone 5 mg 1 to 2 every 6 hours as needed for pain, pentoxifylline 400 mg twice a day, Percocet p.r.n., Seroquel 50 mg at night, simvastatin 20 mg at night, tamsulosin 0.4 mg a day, Xtandi 40 mg once a day, folic acid once a day, iron sulfate, metformin 1000 mg twice a day, aspirin 81 mg a day.   ALLERGIES:  No known drug allergies.   REVIEW OF SYSTEMS: The patient is only partially cooperative with this but endorses being depressed, endorses suicidal ideation, but denies that he has any plan or intent. Denies hallucinations. Admits that he is feeling uncomfortable, not hungry, sleeps poorly.   MENTAL STATUS EXAMINATION: Very sick, chronically ill-looking gentleman, also has a look about him of being very depressed. Only slightly cooperative with the examination. He answers most questions by  either yelling or grunting at me and several times told me that he was done with me, but then did answer a few more questions. Eye contact poor. Psychomotor activity very slow. Speech is minimal in amount and is either whispered or shouting. Affect looks pained. He looks like he is in a great deal of distress and agony in his face. Mood is stated as depressed. Thoughts slow, decreased in amount. No obvious delusions. Denies hallucinations. Endorses wishes that he were dead, but denies suicidal ideation. No homicidal ideation. The patient would not cooperate with cognitive testing. I attempted to do memory testing but he would not cooperate. He does appear to be alert and oriented x 4 and remembered me from yesterday. Fund of knowledge hard to judge. His judgment and insight seem to be quite poor. He is unable to engage in any kind of rational conversation about his health care really.   VITAL SIGNS: Most recently, temperature 99, pulse 84, blood pressure 145/81.   LABORATORY RESULTS: Glucose is continuing to run in the mid 200s for the most part, creatinine elevated at 1.36, multiple electrolyte abnormalities. CBC done earlier, chronic anemia at 28.8, hemoglobin 9.8. CT of the head done on the 21st: Age-related changes, nothing acute.   ASSESSMENT: This is a 74 year old man with multiple symptoms of major depression. Does not appear to be attempting to kill himself. Contrary to what I had been told earlier, he actually is eating, the intake charted is not great, but is not zero either. He is cooperative with his medication, although he frequently asked questions about them. He is not completely uncooperative or refusing treatment. He does, however, refuse to have a discussion seriously about his depression. He refuses to cooperate with discharge planning or with discussing further mental health treatment. From my assessment today, I would say that he does not necessarily meet commitment criteria and does not  necessarily need psychiatric hospitalization in a geriatric psychiatry unit, although it would not be out of the question if he were more cooperative. I do, however, think that he is not showing capacity to make reasonable decisions right now. If there is any question about placement I think it should be considered that he is really not showing capacity when it comes to placement in a skilled nursing facility. I have gone ahead and changed his antidepressant. Discontinue the Celexa and replace it with mirtazapine 30 mg at night, which I plan to try to increase to 45 at night. Try to do some psychoeducation and supportive therapy. We will follow up while he is in the hospital.   DIAGNOSIS, PRINCIPAL AND PRIMARY:  AXIS I: Major depression, single, severe.   SECONDARY DIAGNOSES:  AXIS I: No further.  AXIS II: No diagnosis.  AXIS III: Multiple medical problems as noted before.    ____________________________ Audery Amel, MD jtc:TT D: 01/09/2014 19:52:14 ET T: 01/09/2014 20:20:26 ET JOB#: [id number]  cc: Audery Amel, MD, <Dictator> Audery Amel MD ELECTRONICALLY SIGNED 01/10/2014 23:13

## 2014-08-09 NOTE — Consult Note (Signed)
Brief Consult Note: Diagnosis: Major depressive disorder recurent severe, anxiety disorder NOS.   Patient was seen by consultant.   Recommend further assessment or treatment.   Orders entered.   Discussed with Attending MD.   Comments: [name] has had a h/o depression and anxiety at least since 2010 when his wife left him. There are multiple stressors: worsening of physical health, finacial, housing as his house is to be condemned, and primary support all leading to worsening of depression. He is irritable and short. He admits to making "suicidal comments" but would never do anything to hurt himself as he does not want to go to hell. He does talk about passive suicidal thoughts of "not wanting to wake up" or "God taking him". He does not want to face new problems: homelessness, physical limitations, spread of prostate cancer. He is angry with his daughter and his providers. He agres to try antidepressant as long as it is inexpensive.  PLAN: 1. Will start Zoloft that the patient knows from the past. He will need augmentation.  2. Please continue low dose Xanax.   3. I will follow up.  Electronic Signatures: [name], [name] (MD)  (Signed 30-Mar-15 17:39)  Authored: Brief Consult Note   Last Updated: 30-Mar-15 17:39 by [name], [name] (MD)

## 2014-08-09 NOTE — Consult Note (Signed)
Psychiatry: Follow-up for this 74 year old gentleman with depression.  Reconsult because of concern about suicidality and disposition. of present illness: Information obtained from the patient and the chart and discussion with nursing and social work.  Patient himself continues to be a poor historian very oppositional to talking with me.  Curses me as soon as I come into the room.  Tells me that all I do is come in and agitate him and stir things up constantly.  I pointed out that I hadn't even spoken to him in several days and couldn't be stirring things up constantly but he is very focused on this.  Patient refuses to admit that he is feeling depressed.  Denies that he made any suicidal statements.  Denies that he has any suicidal intent.  Tells me several times that he firmly believes he would go to hell if he killed himself.  Denies that he made any statements of suicidality to his family.  The chart indicates that his daughter called today and reported that the patient had stated several times that he was going to kill himself.  He tells me today that he does wish that he would die and feels like he be better off dead.  He apparently has been cooperative with medication.  There is concern because he is refusing to cooperate with discharge to an appropriate living facility.  Continues to insist that he can't afford it.  Won't offer any kind of alternative. of systems: Not very reliable.  Denies suicidality denies feeling depressed.  Admits to feeling fatigued and tired.  Denies hallucinations.  Drink is better and he is no longer short of breath. status exam: Elderly man looks chronically ill.  Eye contact intermittent.  Psychomotor activity only a little bit fidgety.  Speech is still marked by long periods of silence interrupted by shouting at me when he gets irritated.  Affect angry much of the time.  Thoughts seem to be somewhat disorganized and very focused on his own misery.  Denies suicidal intent but  admits that he wishes he were dead.  Won't cooperate with any cognitive testing. depression appears to be just as bad as it was last time I saw him perhaps worse.  He is not a reliable historian.  I do not feel that his denial of suicidality is adequately trustworthy especially given the comments he allegedly made to his family.  Patient would be safer with placement at a geriatric psychiatry unit for further treatment of his depression.  His sickness and need for nursing assistance making him be on the capacity of our inpatient unit here. plan: Increase Remeron to 45 mg at night.  Add 5 mg of Abilify daily for depression.  I'm requesting that social work referred him to a geriatric psychiatry facility.  I have taken out paperwork putting him on involuntary commitment for now. Major depression severe recurrent  Electronic Signatures: Jenina Moening, Jackquline DenmarkJohn T (MD)  (Signed on 29-Sep-15 17:27)  Authored  Last Updated: 29-Sep-15 17:27 by Audery Amellapacs, Ladonna Vanorder T (MD)

## 2014-08-09 NOTE — Op Note (Signed)
PATIENT NAME:  [name], Kamil D MR#:  [id number] DATE OF BIRTH:  02-17-1941  DATE OF PROCEDURE:  01/06/2014  PREOPERATIVE DIAGNOSIS: Left ureteral calculus in the distal third.  POSTOPERATIVE DIAGNOSIS: Severe left ureteral stricture. No calculus identified. Multiple passed calculi identified in the bladder.   PROCEDURE: Cystoscopy, left retrograde pyelogram, ureteroscopy and stent.   SURGEON: Kylinn Shropshire D. Edwyna ShellHart, DO  ANESTHESIA: General.   SPECIMEN: None.   PROCEDURE IN DETAIL: With the patient sterilely prepped and draped in supine lithotomy position, under good relaxation from the general anesthetic, and after an appropriate timeout, I began the procedure. Cystoscopy was done with a 21 French sheath with a 30 degree lens. The urethra is normal. There is some middle lobe hypertrophy of the prostate. Minimal lateral lobe hypertrophy. The bladder has some trabeculation with multiple small tiny calculi scattered throughout the floor of the bladder. These appear to be yellowish-like as one would see with uric acid calculi from a hypermetabolic syndrome. The patient has a normal-appearing ureteral orifice and the retrograde reveals a hydronephrosis from just above and cephalad to the pelvic brim. So I put an 0.035 Glidewire up the ureter, which easily traverses the whole ureter into the kidney. It is in good position there and I take a rigid short ureteroscope, telescoping, 7 French ureteroscope, but cannot get beyond the area of constriction at the pelvic brim. I cannot get another wire through this area either. I attempt an 0.025 Glidewire and it will not go up as a second wire.   So I am able to easily put a 5 [place] open-ended catheter up into the kidney, through the strictured area, but I cannot get a scope through it, and I will not be able to do flexible ureteroscopy through it. I put a stent up, hopefully it will dilate. The stent is a 26 cm 6 [place] double coiled stent. It is in good position in the  bladder and in the kidney. I checked it at the end of the procedure visually and with fluoroscopy. Fluoroscopy was used throughout this case.   The bladder is emptied. I evacuate out these tiny fragments of calculi. They are too small to even address and send a specimen or to keep a specimen. The bladder is emptied and 30 mL of 0.5% Marcaine was placed in the bladder, B and O suppository in the rectum. Rectal examination reveals no metastatic pelvic fixation or evidence of metastatic disease.    ____________________________ Caralyn Guileichard D. Edwyna ShellHart, DO rdh:JT D: 01/06/2014 09:23:54 ET T: 01/06/2014 10:59:25 ET JOB#: [id number]  cc: Caralyn Guileichard D. Edwyna ShellHart, DO, <Dictator> Bree Heinzelman D Cendy Oconnor DO ELECTRONICALLY SIGNED 01/10/2014 15:38

## 2015-06-03 IMAGING — CT CT ANGIO CHEST
2 of 6 series · 19 of 46 positions shown · IV contrast (APPLIED)
Comparison: None.

CLINICAL DATA: Prostate cancer

EXAM:
CT ANGIOGRAPHY CHEST WITH CONTRAST
TECHNIQUE: Multidetector CT imaging of the chest was performed using the
standard protocol during bolus administration of intravenous
contrast. Multiplanar CT image reconstructions and MIPs were
obtained to evaluate the vascular anatomy.
CONTRAST:  75 cc Isovue 370

[Series 5: pe thins 1.5 · axial · 0.68mm/px · z∈[-447,-181]mm · 16 of 250 slices shown]
[im 14/250  lung]
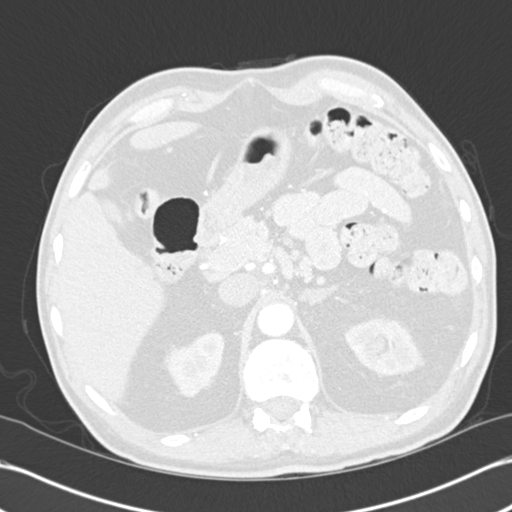
[im 27/250  soft-tissue]
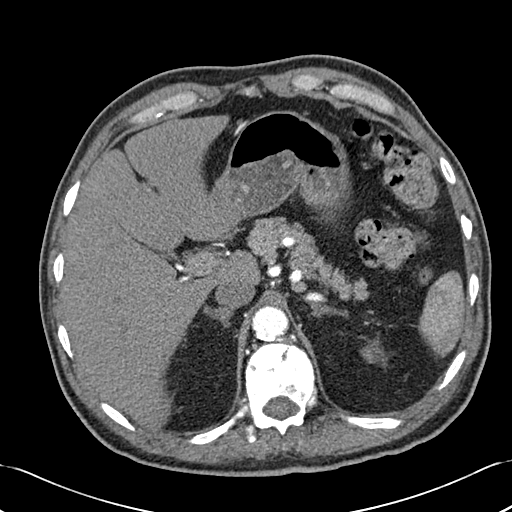
[im 40/250  lung]
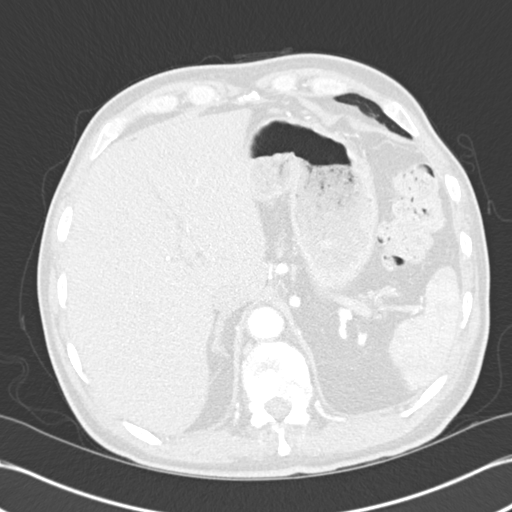
[im 53/250  soft-tissue]
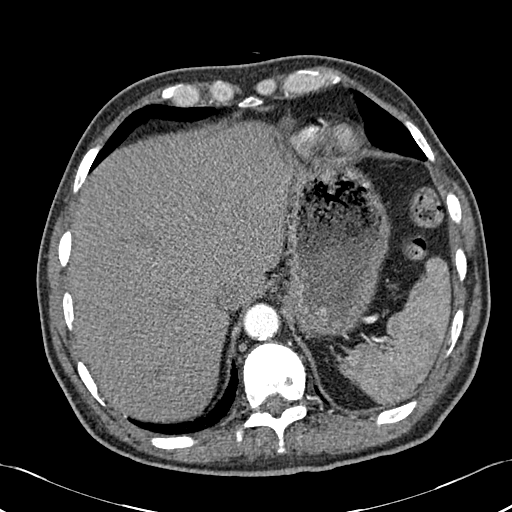
[im 79/250  lung]
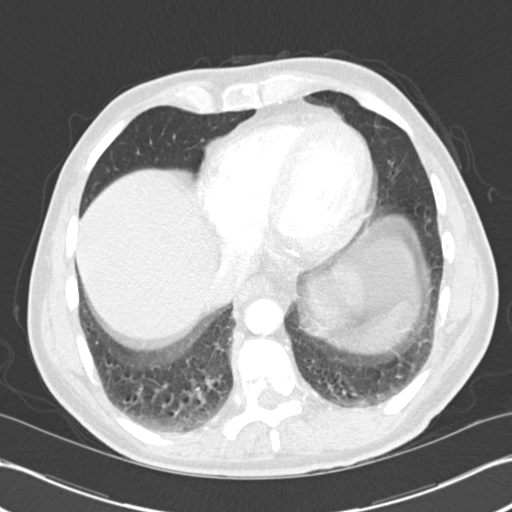
[im 92/250  soft-tissue]
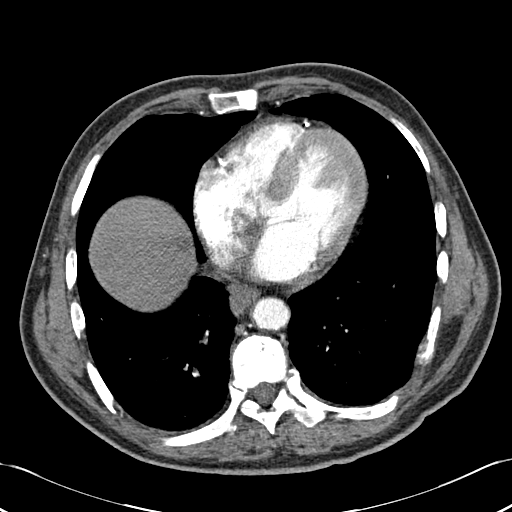
[im 105/250  lung]
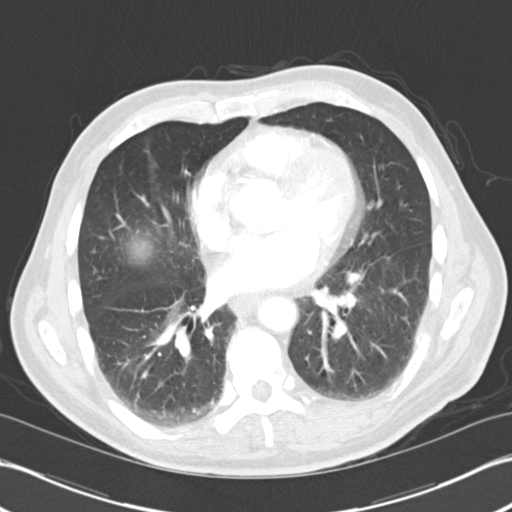
[im 118/250  soft-tissue]
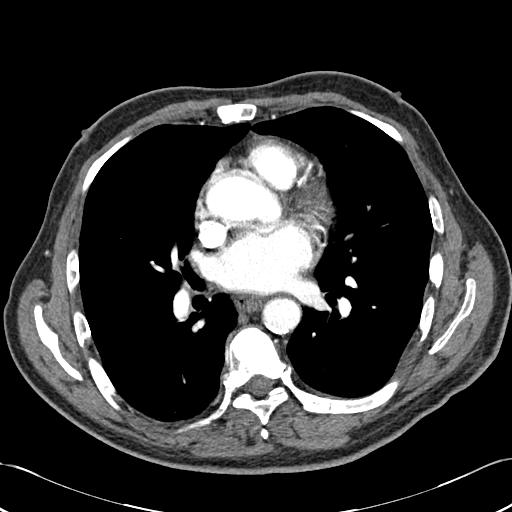
[im 132/250  lung]
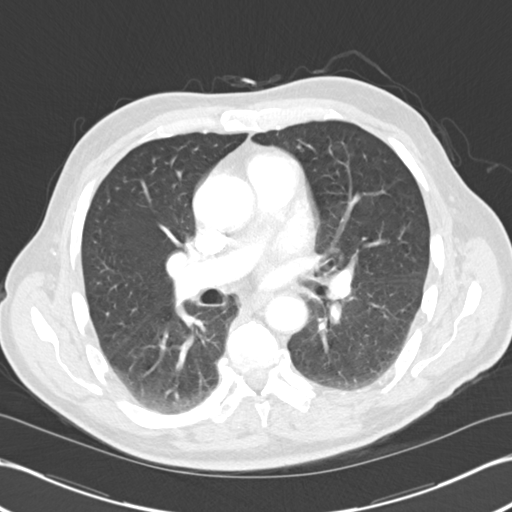
[im 145/250  soft-tissue]
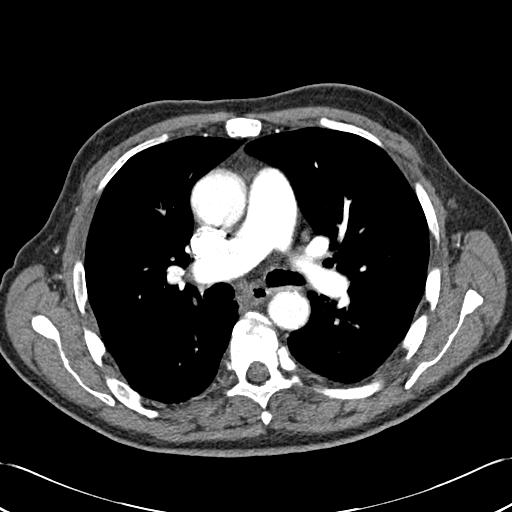
[im 158/250  lung]
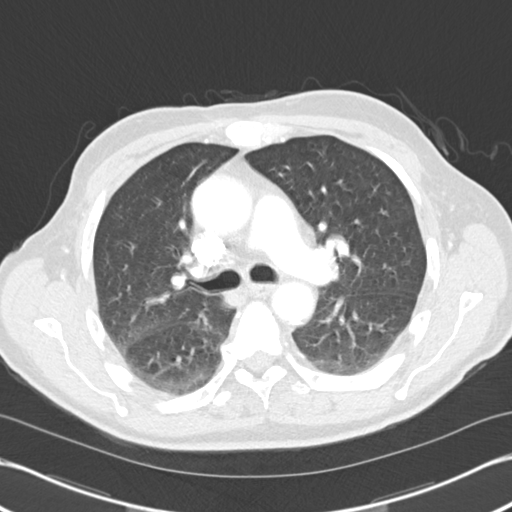
[im 171/250  soft-tissue]
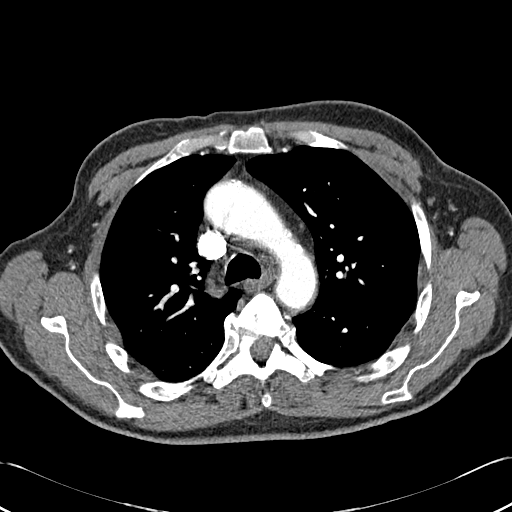
[im 197/250  lung]
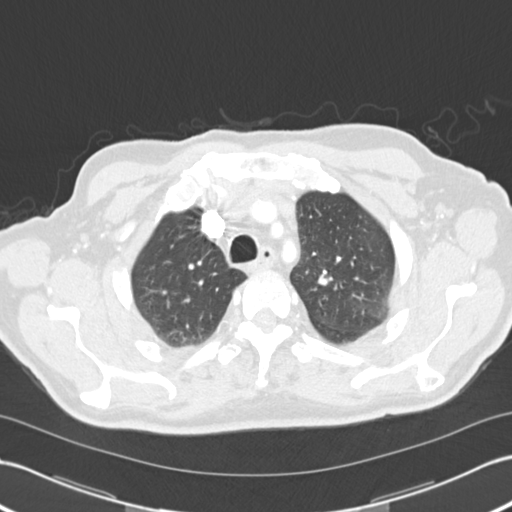
[im 210/250  soft-tissue]
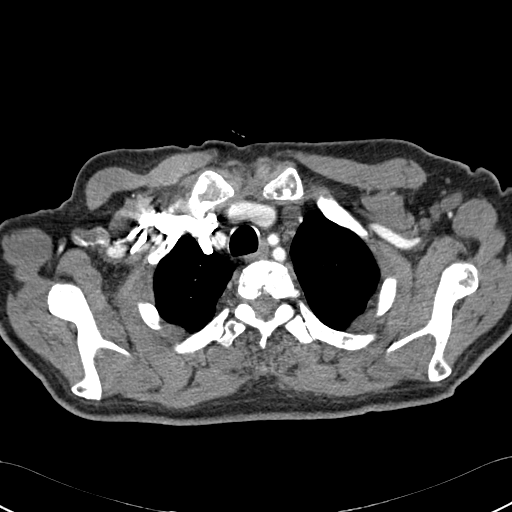
[im 223/250  lung]
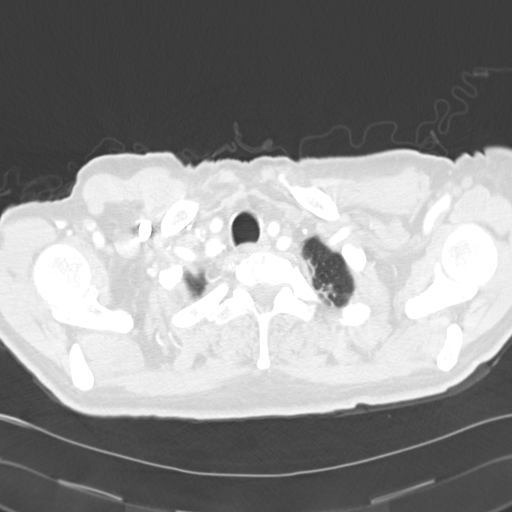
[im 236/250  soft-tissue]
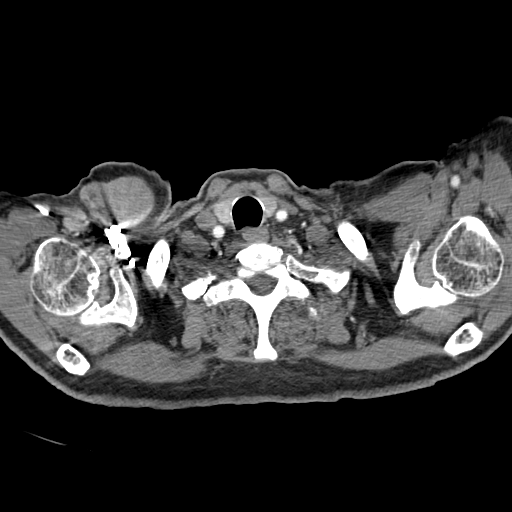

[Series 7: cor mpr 2.0 · coronal · 0.60mm/px · 3 of 133 slices shown]
[im 34/133  soft-tissue]
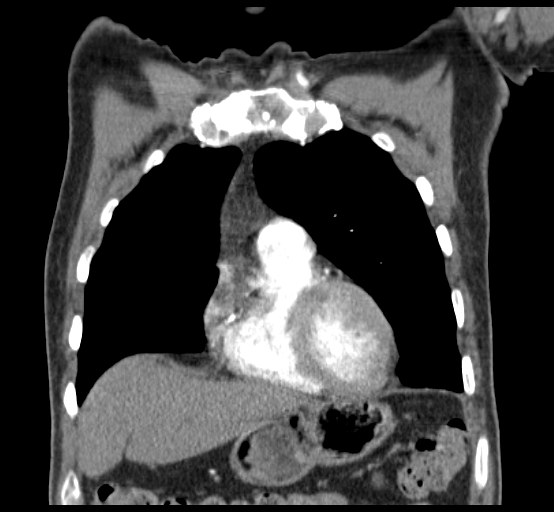
[im 67/133  soft-tissue]
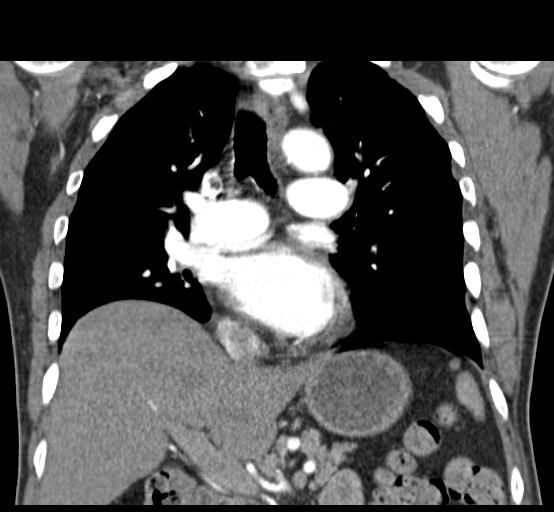
[im 100/133  soft-tissue]
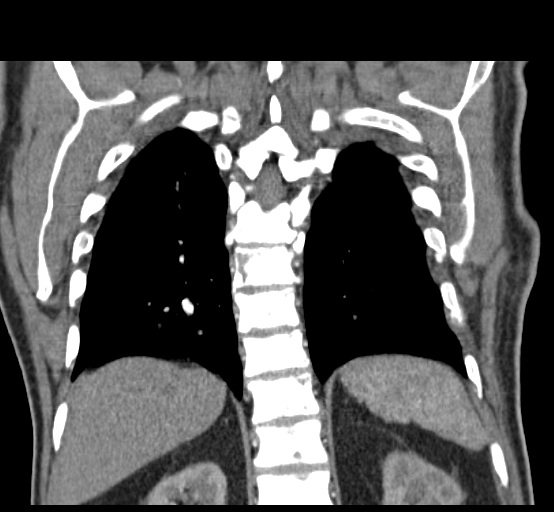

[19 of 46 positions shown; findings below may reference images not displayed]

FINDINGS: There are no filling defects in the pulmonary arterial tree to
suggest acute pulmonary thromboembolism.

Maximal ascending aortic diameter is 3.8 cm. No evidence of
dissection. Mild atherosclerotic changes of the proximal great
vessels. Three vessel coronary artery calcifications are noted.

No abnormal mediastinal adenopathy or pericardial effusion. Thyroid
is mildly heterogeneous and normal in size.

No pneumothorax. No pleural effusion. Left upper lobe calcified
granuloma on image 33 of series 6.

Widespread metastatic predominately sclerotic bone disease is
present. Some lesions have a non sclerotic matrix and extend beyond
the confines of the bone. This includes expansile lesions at the
inferior tip of the scapula, an 8 left posterior lateral rib lesion
on image 92. No obvious pathologic fracture. Mild wedging of T12 has
a chronic appearance. There is no obvious epidural tumor in the
thoracic spine.

Upper abdomen is benign. Cystic area within the central left kidney
is nonspecific.

Review of the MIP images confirms the above findings.
IMPRESSION: No evidence of acute pulmonary thromboembolism.

Widespread metastatic osseous disease including at least 2 expansile
bone lesions in a left posterior lateral rib and the inferior tip of
the left scapula.
